# Patient Record
Sex: Female | Born: 1990
Health system: Southern US, Community
[De-identification: ages and names within clinical notes are randomized; demographics above are authoritative.]

## PROBLEM LIST (undated history)

## (undated) DIAGNOSIS — M549 Dorsalgia, unspecified: Secondary | ICD-10-CM

## (undated) DIAGNOSIS — J45909 Unspecified asthma, uncomplicated: Secondary | ICD-10-CM

## (undated) DIAGNOSIS — L732 Hidradenitis suppurativa: Secondary | ICD-10-CM

## (undated) HISTORY — PX: NO PAST SURGERIES: SHX2092

## (undated) HISTORY — PX: TUBAL LIGATION: SHX77

---

## 2015-06-08 ENCOUNTER — Emergency Department (HOSPITAL_BASED_OUTPATIENT_CLINIC_OR_DEPARTMENT_OTHER)
Admission: EM | Admit: 2015-06-08 | Discharge: 2015-06-08 | Discharge status: Against Medical Advice | Payer: Medicaid Other | Attending: Emergency Medicine | Admitting: Emergency Medicine

## 2015-06-08 ENCOUNTER — Encounter (HOSPITAL_BASED_OUTPATIENT_CLINIC_OR_DEPARTMENT_OTHER): Payer: Self-pay | Admitting: *Deleted

## 2015-06-08 ENCOUNTER — Emergency Department (HOSPITAL_BASED_OUTPATIENT_CLINIC_OR_DEPARTMENT_OTHER): Payer: Medicaid Other

## 2015-06-08 DIAGNOSIS — F17210 Nicotine dependence, cigarettes, uncomplicated: Secondary | ICD-10-CM | POA: Diagnosis not present

## 2015-06-08 DIAGNOSIS — M25461 Effusion, right knee: Secondary | ICD-10-CM | POA: Insufficient documentation

## 2015-06-08 DIAGNOSIS — M25561 Pain in right knee: Secondary | ICD-10-CM | POA: Diagnosis not present

## 2015-06-08 NOTE — ED Notes (Signed)
Right knee swelling and pain x 2 days. No injury.

## 2016-07-04 ENCOUNTER — Emergency Department (HOSPITAL_BASED_OUTPATIENT_CLINIC_OR_DEPARTMENT_OTHER)
Admission: EM | Admit: 2016-07-04 | Discharge: 2016-07-04 | Disposition: A | Discharge status: Home / Self Care | Payer: Medicaid Other | Attending: Emergency Medicine | Admitting: Emergency Medicine

## 2016-07-04 ENCOUNTER — Encounter (HOSPITAL_BASED_OUTPATIENT_CLINIC_OR_DEPARTMENT_OTHER): Payer: Self-pay | Admitting: *Deleted

## 2016-07-04 DIAGNOSIS — F17210 Nicotine dependence, cigarettes, uncomplicated: Secondary | ICD-10-CM | POA: Insufficient documentation

## 2016-07-04 DIAGNOSIS — J45909 Unspecified asthma, uncomplicated: Secondary | ICD-10-CM | POA: Insufficient documentation

## 2016-07-04 DIAGNOSIS — K029 Dental caries, unspecified: Secondary | ICD-10-CM | POA: Insufficient documentation

## 2016-07-04 HISTORY — DX: Unspecified asthma, uncomplicated: J45.909

## 2016-07-04 MED ORDER — BUPIVACAINE-EPINEPHRINE (PF) 0.5% -1:200000 IJ SOLN
1.8000 mL | Freq: Once | INTRAMUSCULAR | Status: AC
  Administered 2016-07-04: 1.8 mL
  Filled 2016-07-04: qty 1.8

## 2016-07-04 MED ORDER — ACETAMINOPHEN 500 MG PO TABS
1000.0000 mg | ORAL_TABLET | Freq: Once | ORAL | Status: AC
  Administered 2016-07-04: 1000 mg via ORAL
  Filled 2016-07-04: qty 2

## 2016-07-04 MED ORDER — OXYCODONE HCL 5 MG PO TABS
5.0000 mg | ORAL_TABLET | Freq: Once | ORAL | Status: AC
  Administered 2016-07-04: 5 mg via ORAL
  Filled 2016-07-04: qty 1

## 2016-07-04 MED ORDER — IBUPROFEN 800 MG PO TABS
800.0000 mg | ORAL_TABLET | Freq: Once | ORAL | Status: AC
  Administered 2016-07-04: 800 mg via ORAL
  Filled 2016-07-04: qty 1

## 2016-07-04 MED ORDER — PENICILLIN V POTASSIUM 500 MG PO TABS
1000.0000 mg | ORAL_TABLET | Freq: Two times a day (BID) | ORAL | 0 refills | Status: DC
Start: 2016-07-04 — End: 2019-01-28

## 2016-07-04 MED FILL — PENICILLIN VK 500 MG TABLET: 500 | 7 days supply | Qty: 28 | Fill #0

## 2016-07-04 NOTE — ED Provider Notes (Signed)
[facility] Provider Note   CSN: [id number] Arrival date & time: 07/04/16  1001     History   Chief Complaint Chief Complaint  Patient presents with  . Dental Pain    HPI Dawn Gould is a 26 y.o. female.  26 yo F with a chief complaint of dental pain. Going on for the past couple days. History of similar problems in the past. Called her dentist since it was a 3 month weight. Denies fevers denies nausea vomiting. Denies difficulty with swallowing. No swelling.   The history is provided by the patient.  Dental Pain   This is a new problem. The current episode started more than 2 days ago. The problem occurs constantly. The problem has not changed since onset.The pain is at a severity of 9/10. The treatment provided no relief.    Past Medical History:  Diagnosis Date  . Asthma     There are no active problems to display for this patient.   History reviewed. No pertinent surgical history.  OB History    No data available       Home Medications    Prior to Admission medications   Medication Sig Start Date End Date Taking? Authorizing Provider  penicillin v potassium (VEETID) 500 MG tablet Take 2 tablets (1,000 mg total) by mouth 2 (two) times daily. X 7 days 07/04/16   [name], DO    Family History History reviewed. No pertinent family history.  Social History Social History  Substance Use Topics  . Smoking status: Current Every Day Smoker    Types: Cigarettes  . Smokeless tobacco: Never Used  . Alcohol use No     Allergies   Patient has no known allergies.   Review of Systems Review of Systems  Constitutional: Negative for chills and fever.  HENT: Positive for dental problem. Negative for congestion and rhinorrhea.   Eyes: Negative for redness and visual disturbance.  Respiratory: Negative for shortness of breath and wheezing.   Cardiovascular: Negative for chest pain and palpitations.  Gastrointestinal: Negative for nausea and  vomiting.  Genitourinary: Negative for dysuria and urgency.  Musculoskeletal: Negative for arthralgias and myalgias.  Skin: Negative for pallor and wound.  Neurological: Positive for headaches. Negative for dizziness.     Physical Exam Updated Vital Signs BP 114/83 (BP Location: Left Arm)   Pulse 66   Temp 99.1 F (37.3 C) (Oral)   Resp 18   LMP 07/04/2016   SpO2 100%   Physical Exam  Constitutional: She is oriented to person, place, and time. She appears well-developed and well-nourished. No distress.  HENT:  Head: Normocephalic and atraumatic.  Mouth/Throat:    Tolerating secretions without difficulty. No sublingual swelling. No areas of erythema or edema.  Eyes: EOM are normal. Pupils are equal, round, and reactive to light.  Neck: Normal range of motion. Neck supple.  Cardiovascular: Normal rate and regular rhythm.  Exam reveals no gallop and no friction rub.   No murmur heard. Pulmonary/Chest: Effort normal. She has no wheezes. She has no rales.  Abdominal: Soft. She exhibits no distension. There is no tenderness.  Musculoskeletal: She exhibits no edema or tenderness.  Neurological: She is alert and oriented to person, place, and time.  Skin: Skin is warm and dry. She is not diaphoretic.  Psychiatric: She has a normal mood and affect. Her behavior is normal.  Nursing note and vitals reviewed.    ED Treatments / Results  Labs (all labs ordered are listed,  but only abnormal results are displayed) Labs Reviewed - No data to display  EKG  EKG Interpretation None       Radiology No results found.  Procedures .Nerve Block Date/Time: 07/04/2016 10:55 AM Performed by: Adela LankFLOYD, Wynnie Pacetti Authorized by: Melene PlanFLOYD, Gearold Wainer   Consent:    Consent obtained:  Verbal   Consent given by:  Patient   Risks discussed:  Allergic reaction, infection, nerve damage, pain and intravenous injection   Alternatives discussed:  No treatment Indications:    Indications:  Pain  relief Location:    Body area:  Head   Head nerve blocked: inferior alveolar.   Laterality:  Right Skin anesthesia (see MAR for exact dosages):    Skin anesthesia method:  None Procedure details (see MAR for exact dosages):    Block needle gauge:  27 G   Anesthetic injected:  Bupivacaine 0.25% WITH epi   Steroid injected:  None   Additive injected:  None   Injection procedure:  Anatomic landmarks identified and anatomic landmarks palpated   Paresthesia:  None Post-procedure details:    Dressing:  None   Outcome:  Pain relieved   Patient tolerance of procedure:  Tolerated well, no immediate complications   (including critical care time)  Medications Ordered in ED Medications  bupivacaine-epinephrine (MARCAINE W/ EPI) 0.5% -1:200000 injection 1.8 mL (1.8 mLs Infiltration Given by Other 07/04/16 1034)  acetaminophen (TYLENOL) tablet 1,000 mg (1,000 mg Oral Given 07/04/16 1033)  ibuprofen (ADVIL,MOTRIN) tablet 800 mg (800 mg Oral Given 07/04/16 1033)  oxyCODONE (Oxy IR/ROXICODONE) immediate release tablet 5 mg (5 mg Oral Given 07/04/16 1033)     Initial Impression / Assessment and Plan / ED Course  I have reviewed the triage vital signs and the nursing notes.  Pertinent labs & imaging results that were available during my care of the patient were reviewed by me and considered in my medical decision making (see chart for details).     26 yo F With dental pain. Dental block performed. Dental follow-up.  10:57 AM:  I have discussed the diagnosis/risks/treatment options with the patient and believe the pt to be eligible for discharge home to follow-up with Dentist. We also discussed returning to the ED immediately if new or worsening sx occur. We discussed the sx which are most concerning (e.g., sudden worsening pain, fever, inability to tolerate by mouth) that necessitate immediate return. Medications administered to the patient during their visit and any new prescriptions provided to the  patient are listed below.  Medications given during this visit Medications  bupivacaine-epinephrine (MARCAINE W/ EPI) 0.5% -1:200000 injection 1.8 mL (1.8 mLs Infiltration Given by Other 07/04/16 1034)  acetaminophen (TYLENOL) tablet 1,000 mg (1,000 mg Oral Given 07/04/16 1033)  ibuprofen (ADVIL,MOTRIN) tablet 800 mg (800 mg Oral Given 07/04/16 1033)  oxyCODONE (Oxy IR/ROXICODONE) immediate release tablet 5 mg (5 mg Oral Given 07/04/16 1033)     The patient appears reasonably screen and/or stabilized for discharge and I doubt any other medical condition or other [facility] requiring further screening, evaluation, or treatment in the ED at this time prior to discharge.    Final Clinical Impressions(s) / ED Diagnoses   Final diagnoses:  Pain due to dental caries    New Prescriptions New Prescriptions   PENICILLIN V POTASSIUM (VEETID) 500 MG TABLET    Take 2 tablets (1,000 mg total) by mouth 2 (two) times daily. X 7 days     Melene Planan Dhalia Zingaro, DO 07/04/16 1057

## 2016-07-04 NOTE — ED Notes (Signed)
ED Provider at bedside. 

## 2016-07-04 NOTE — Discharge Instructions (Signed)
Take 4 over the counter ibuprofen tablets 3 times a day or 2 over-the-counter naproxen tablets twice a day for pain. Also take tylenol 1000mg(2 extra strength) four times a day.    

## 2016-07-04 NOTE — ED Triage Notes (Signed)
Pt reports 3 days of right sided tooth pain upper and lower, also right ear pain causing her to have a right sided headache.

## 2016-08-15 ENCOUNTER — Emergency Department (HOSPITAL_BASED_OUTPATIENT_CLINIC_OR_DEPARTMENT_OTHER)
Admission: EM | Admit: 2016-08-15 | Discharge: 2016-08-15 | Disposition: A | Discharge status: Home / Self Care | Payer: Medicaid Other | Attending: Emergency Medicine | Admitting: Emergency Medicine

## 2016-08-15 ENCOUNTER — Encounter (HOSPITAL_BASED_OUTPATIENT_CLINIC_OR_DEPARTMENT_OTHER): Payer: Self-pay | Admitting: *Deleted

## 2016-08-15 DIAGNOSIS — J069 Acute upper respiratory infection, unspecified: Secondary | ICD-10-CM | POA: Insufficient documentation

## 2016-08-15 DIAGNOSIS — F17210 Nicotine dependence, cigarettes, uncomplicated: Secondary | ICD-10-CM | POA: Insufficient documentation

## 2016-08-15 DIAGNOSIS — R112 Nausea with vomiting, unspecified: Secondary | ICD-10-CM | POA: Insufficient documentation

## 2016-08-15 DIAGNOSIS — R109 Unspecified abdominal pain: Secondary | ICD-10-CM | POA: Insufficient documentation

## 2016-08-15 DIAGNOSIS — J45909 Unspecified asthma, uncomplicated: Secondary | ICD-10-CM | POA: Insufficient documentation

## 2016-08-15 MED ORDER — BENZONATATE 100 MG PO CAPS: 100.0000 mg | ORAL_CAPSULE | Freq: Three times a day (TID) | ORAL | 0 refills | Status: DC

## 2016-08-15 MED ORDER — DM-GUAIFENESIN ER 30-600 MG PO TB12
1.0000 | ORAL_TABLET | Freq: Two times a day (BID) | ORAL | 0 refills | Status: DC | PRN
Start: 2016-08-15 — End: 2019-01-28

## 2016-08-15 MED ORDER — FLUTICASONE PROPIONATE 50 MCG/ACT NA SUSP: 2.0000 | Freq: Every day | NASAL | 0 refills | Status: DC

## 2016-08-15 MED ORDER — ONDANSETRON 4 MG PO TBDP: 4.0000 mg | ORAL_TABLET | Freq: Three times a day (TID) | ORAL | 0 refills | Status: DC | PRN

## 2016-08-15 MED ORDER — ONDANSETRON 4 MG PO TBDP
4.0000 mg | ORAL_TABLET | Freq: Once | ORAL | Status: AC
  Administered 2016-08-15: 4 mg via ORAL
  Filled 2016-08-15: qty 1

## 2016-08-15 MED ORDER — OSELTAMIVIR PHOSPHATE 75 MG PO CAPS: 75.0000 mg | ORAL_CAPSULE | Freq: Two times a day (BID) | ORAL | 0 refills | Status: DC

## 2016-08-15 MED ORDER — ACETAMINOPHEN 500 MG PO TABS
1000.0000 mg | ORAL_TABLET | Freq: Once | ORAL | Status: AC
  Administered 2016-08-15: 1000 mg via ORAL
  Filled 2016-08-15: qty 2

## 2016-08-15 MED FILL — ONDANSETRON ODT 4 MG TABLET: 4 | 3 days supply | Qty: 10 | Fill #0

## 2016-08-15 MED FILL — MUCINEX DM ER 600-30 MG TAB: 30-600 | 10 days supply | Qty: 20 | Fill #0

## 2016-08-15 MED FILL — BENZONATATE 100 MG CAP: 100 | 7 days supply | Qty: 21 | Fill #0

## 2016-08-15 MED FILL — FLUTICASONE PROP 50 MCG SPR: 50 | 30 days supply | Qty: 16 | Fill #0

## 2016-08-15 NOTE — ED Triage Notes (Signed)
Cough, chills, body aches and vomiting since yesterday.

## 2016-08-15 NOTE — ED Provider Notes (Signed)
[facility]   CSN: [id number] Arrival date & time: 08/15/16  1359   By signing my name below, I, Dawn Gould, attest that this documentation has been prepared under the direction and in the presence of Dawn Gould, [place]. Electronically Signed: Clarisse Gould, Scribe. 08/15/16. 5:03 PM.   History   Chief Complaint Chief Complaint  Patient presents with  . Cough   The history is provided by the patient and medical records. No language interpreter was used.    HPI Comments: Dawn Gould is a 26 y.o. female who presents to the Emergency Department complaining of worsening productive cough x 2 days. She notes yellow sputum in cough without blood. Pt notes associated fever, chills, post-tussive vomit, body aches, chest tightness and abdominal pain only when coughing, congestion and rhinorrhea. Pt states she is unable to keep solids or fluids down. She reports she has not attempted any treatment at home. She states Zofran and tylenol administered in the ED have brought relief to cough and nausea. Hx of asthma noted. She adds she ran out of albuterol at home. Baseline constipation and Hx of hemorrhoids reported. Pt denies sore throat, dysuria, hematuria, bowel changes and Hx of COPD.  Past Medical History:  Diagnosis Date  . Asthma     There are no active problems to display for this patient.   History reviewed. No pertinent surgical history.  OB History    No data available       Home Medications    Prior to Admission medications   Medication Sig Start Date End Date Taking? Authorizing Provider  benzonatate (TESSALON) 100 MG capsule Take 1 capsule (100 mg total) by mouth every 8 (eight) hours. 08/15/16   Dawn Gould [place], [place]  dextromethorphan-guaiFENesin (MUCINEX DM) 30-600 MG 12hr tablet Take 1 tablet by mouth 2 (two) times daily as needed for cough. 08/15/16   Dawn Gould [place], [place]  fluticasone (FLONASE) 50 MCG/ACT nasal spray Place 2  sprays into both nostrils daily. 08/15/16   [name] Gould Dawn Gould, [place]  ondansetron (ZOFRAN ODT) 4 MG disintegrating tablet Take 1 tablet (4 mg total) by mouth every 8 (eight) hours as needed for nausea or vomiting. 08/15/16   Dawn Gould [place], [place]  oseltamivir (TAMIFLU) 75 MG capsule Take 1 capsule (75 mg total) by mouth every 12 (twelve) hours. 08/15/16   Dawn Gould Gould [place], [place]  penicillin v potassium (VEETID) 500 MG tablet Take 2 tablets (1,000 mg total) by mouth 2 (two) times daily. X 7 days 07/04/16   Dawn Plan, DO    Family History No family history on file.  Social History Social History  Substance Use Topics  . Smoking status: Current Every Day Smoker    Types: Cigarettes  . Smokeless tobacco: Never Used  . Alcohol use No     Allergies   Tramadol   Review of Systems Review of Systems  Constitutional: Positive for chills. Negative for diaphoresis and fever.  HENT: Positive for congestion and rhinorrhea. Negative for sore throat.   Respiratory: Positive for cough and chest tightness.   Gastrointestinal: Positive for abdominal pain, nausea and vomiting. Negative for constipation and diarrhea.  Genitourinary: Negative for dysuria and hematuria.  Musculoskeletal: Positive for arthralgias and myalgias.  All other systems reviewed and are negative.    Physical Exam Updated Vital Signs BP 113/67 (BP Location: Left Arm)   Pulse 105   Temp 98.8 F (37.1 C) (Oral)   Resp 16   Ht 5\' 3"  (1.6 m)  Wt 137 lb (62.1 kg)   LMP 08/02/2016   SpO2 100%   BMI 24.27 kg/m   Physical Exam  Constitutional: She is oriented to person, place, and time. She appears well-developed and well-nourished. No distress.  Well appearing  HENT:  Head: Normocephalic and atraumatic.  Right Ear: External ear normal.  Left Ear: External ear normal.  Nose: Nose normal.  Mouth/Throat: Oropharynx is clear and moist. No oropharyngeal exudate.  Oropharynx without evidence of redness or  exudates. Tonsils without evidence of redness, swelling, or exudates. TM's appear normal with no evidence of bulging. EAC appear non erythematous and not swollen  Eyes: EOM are normal. Pupils are equal, round, and reactive to light.  Neck: Normal range of motion. Neck supple.  Normal ROM. No nuchal rigidity.   Cardiovascular: Normal rate, regular rhythm and normal heart sounds.  Exam reveals no gallop and no friction rub.   No murmur heard. Pulmonary/Chest: Effort normal and breath sounds normal. No respiratory distress. She has no wheezes. She has no rales.  Lungs CTA. No wheezing. No rales. No stridor. Normal work of breathing  Abdominal: Soft. She exhibits no distension. There is no tenderness. There is no rebound and no guarding.  Soft and nontender. No rebound. No guarding. Negative murphy's sign. No focal tenderness at McBurney's point. No CVA tenderness. No evidence of hernia  Musculoskeletal: She exhibits no edema or tenderness.  Neurological: She is alert and oriented to person, place, and time.  Skin: Skin is warm and dry. She is not diaphoretic.  Psychiatric: She has a normal mood and affect. Her behavior is normal.  Nursing note and vitals reviewed.    ED Treatments / Results  DIAGNOSTIC STUDIES: Oxygen Saturation is 100% on RA, normal by my interpretation.    COORDINATION OF CARE: 5:01 PM Discussed treatment Gould with pt at bedside and pt agreed to Gould. Will order medications and prepare pt for discharge.  Labs (all labs ordered are listed, but only abnormal results are displayed) Labs Reviewed - No data to display  EKG  EKG Interpretation None       Radiology No results found.  Procedures Procedures (including critical care time)  Medications Ordered in ED Medications  acetaminophen (TYLENOL) tablet 1,000 mg (1,000 mg Oral Given 08/15/16 1412)  ondansetron (ZOFRAN-ODT) disintegrating tablet 4 mg (4 mg Oral Given 08/15/16 1412)     Initial Impression /  Assessment and Gould / ED Course  I have reviewed the triage vital signs and the nursing notes.  Pertinent labs & imaging results that were available during my care of the patient were reviewed by me and considered in my medical decision making (see chart for details).      Patient with symptoms consistent with upper respiratory infection, possible influenza.  Vitals are stable, no fever.  No signs of dehydration, tolerating PO's.  Lungs are clear. Due to patient's presentation and physical exam a chest x-ray was not ordered.  Discussed the cost versus benefit of Tamiflu treatment with the patient.  The patient understands that symptoms are within the recommended 24-48 hour window of treatment.  Patient will be discharged with instructions to orally hydrate, rest, and use over-the-counter medications such as anti-inflammatories ibuprofen and Aleve for muscle aches and Tylenol for fever.  Patient will also be given a cough suppressant, flonase, mucinex. Patient verbally understands instructions. Reasons to immediately return to the emergency department discussed.  I personally performed the services described in this documentation, which was scribed in my presence.  The recorded information has been reviewed and is accurate.  Final Clinical Impressions(s) / ED Diagnoses   Final diagnoses:  Upper respiratory tract infection, unspecified type    New Prescriptions Discharge Medication List as of 08/15/2016  5:24 PM    START taking these medications   Details  benzonatate (TESSALON) 100 MG capsule Take 1 capsule (100 mg total) by mouth every 8 (eight) hours., Starting Tue 08/15/2016, Print    dextromethorphan-guaiFENesin (MUCINEX DM) 30-600 MG 12hr tablet Take 1 tablet by mouth 2 (two) times daily as needed for cough., Starting Tue 08/15/2016, Print    fluticasone (FLONASE) 50 MCG/ACT nasal spray Place 2 sprays into both nostrils daily., Starting Tue 08/15/2016, Print    ondansetron (ZOFRAN ODT) 4  MG disintegrating tablet Take 1 tablet (4 mg total) by mouth every 8 (eight) hours as needed for nausea or vomiting., Starting Tue 08/15/2016, Print    oseltamivir (TAMIFLU) 75 MG capsule Take 1 capsule (75 mg total) by mouth every 12 (twelve) hours., Starting Tue 08/15/2016, Print         [address] [place], [place] 08/16/16 1610    Loren Racer, MD 08/18/16 [phone]

## 2016-08-15 NOTE — Discharge Instructions (Signed)
1. Medications: flonase, mucinex, tessalon, tamiflu 2. Treatment: rest, drink plenty of fluids, take tylenol or ibuprofen for fever control 3. Follow Up: Please followup with your primary doctor in 3 days for discussion of your diagnoses and further evaluation after today's visit; if you do not have a primary care doctor use the resource guide provided to find one; Return to the ER for high fevers, difficulty breathing or other concerning symptoms

## 2017-01-18 ENCOUNTER — Encounter (HOSPITAL_BASED_OUTPATIENT_CLINIC_OR_DEPARTMENT_OTHER): Payer: Self-pay | Admitting: *Deleted

## 2017-01-18 ENCOUNTER — Emergency Department (HOSPITAL_BASED_OUTPATIENT_CLINIC_OR_DEPARTMENT_OTHER)
Admission: EM | Admit: 2017-01-18 | Discharge: 2017-01-18 | Disposition: A | Discharge status: Home / Self Care | Payer: No Typology Code available for payment source | Attending: Emergency Medicine | Admitting: Emergency Medicine

## 2017-01-18 DIAGNOSIS — J45909 Unspecified asthma, uncomplicated: Secondary | ICD-10-CM | POA: Diagnosis not present

## 2017-01-18 DIAGNOSIS — M62830 Muscle spasm of back: Secondary | ICD-10-CM | POA: Insufficient documentation

## 2017-01-18 DIAGNOSIS — S3992XA Unspecified injury of lower back, initial encounter: Secondary | ICD-10-CM | POA: Diagnosis present

## 2017-01-18 DIAGNOSIS — F17210 Nicotine dependence, cigarettes, uncomplicated: Secondary | ICD-10-CM | POA: Diagnosis not present

## 2017-01-18 DIAGNOSIS — Y9301 Activity, walking, marching and hiking: Secondary | ICD-10-CM | POA: Diagnosis not present

## 2017-01-18 DIAGNOSIS — Y929 Unspecified place or not applicable: Secondary | ICD-10-CM | POA: Insufficient documentation

## 2017-01-18 DIAGNOSIS — W109XXA Fall (on) (from) unspecified stairs and steps, initial encounter: Secondary | ICD-10-CM | POA: Insufficient documentation

## 2017-01-18 DIAGNOSIS — Y999 Unspecified external cause status: Secondary | ICD-10-CM | POA: Insufficient documentation

## 2017-01-18 DIAGNOSIS — S39012A Strain of muscle, fascia and tendon of lower back, initial encounter: Secondary | ICD-10-CM | POA: Insufficient documentation

## 2017-01-18 HISTORY — DX: Dorsalgia, unspecified: M54.9

## 2017-01-18 LAB — PREGNANCY, URINE: PREG TEST UR: NEGATIVE

## 2017-01-18 MED ORDER — CYCLOBENZAPRINE HCL 10 MG PO TABS: 10.0000 mg | ORAL_TABLET | Freq: Every day | ORAL | 0 refills | Status: AC
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
## 2017-01-18 NOTE — ED Notes (Signed)
Pt given note for work and directed to pharmacy to pick up Rx

## 2017-01-18 NOTE — Discharge Instructions (Signed)
Use heat pads intermittently throughout the day. Massage therapy also may be beneficial. Slow lower back stretches will also be beneficial. He may use topical muscle creams, over-the-counter medicines such as ibuprofen, Aleve, or Tylenol.
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
## 2017-05-08 ENCOUNTER — Other Ambulatory Visit: Payer: Self-pay

## 2017-05-08 ENCOUNTER — Emergency Department (HOSPITAL_BASED_OUTPATIENT_CLINIC_OR_DEPARTMENT_OTHER)
Admission: EM | Admit: 2017-05-08 | Discharge: 2017-05-08 | Disposition: A | Discharge status: Home / Self Care | Payer: Self-pay | Attending: Emergency Medicine | Admitting: Emergency Medicine

## 2017-05-08 ENCOUNTER — Emergency Department (HOSPITAL_BASED_OUTPATIENT_CLINIC_OR_DEPARTMENT_OTHER): Payer: Self-pay

## 2017-05-08 ENCOUNTER — Encounter (HOSPITAL_BASED_OUTPATIENT_CLINIC_OR_DEPARTMENT_OTHER): Payer: Self-pay | Admitting: Emergency Medicine

## 2017-05-08 DIAGNOSIS — F17210 Nicotine dependence, cigarettes, uncomplicated: Secondary | ICD-10-CM | POA: Insufficient documentation

## 2017-05-08 DIAGNOSIS — Y999 Unspecified external cause status: Secondary | ICD-10-CM | POA: Insufficient documentation

## 2017-05-08 DIAGNOSIS — X503XXA Overexertion from repetitive movements, initial encounter: Secondary | ICD-10-CM | POA: Insufficient documentation

## 2017-05-08 DIAGNOSIS — Z79899 Other long term (current) drug therapy: Secondary | ICD-10-CM | POA: Insufficient documentation

## 2017-05-08 DIAGNOSIS — M25461 Effusion, right knee: Secondary | ICD-10-CM | POA: Insufficient documentation

## 2017-05-08 DIAGNOSIS — S86911A Strain of unspecified muscle(s) and tendon(s) at lower leg level, right leg, initial encounter: Secondary | ICD-10-CM | POA: Insufficient documentation

## 2017-05-08 DIAGNOSIS — J45909 Unspecified asthma, uncomplicated: Secondary | ICD-10-CM | POA: Insufficient documentation

## 2017-05-08 DIAGNOSIS — Y929 Unspecified place or not applicable: Secondary | ICD-10-CM | POA: Insufficient documentation

## 2017-05-08 DIAGNOSIS — Y9389 Activity, other specified: Secondary | ICD-10-CM | POA: Insufficient documentation

## 2017-05-08 MED ORDER — HYDROCODONE-ACETAMINOPHEN 5-325 MG PO TABS: 1.0000 | ORAL_TABLET | ORAL | 0 refills | Status: DC | PRN

## 2017-05-08 MED ORDER — IBUPROFEN 800 MG PO TABS
800.0000 mg | ORAL_TABLET | Freq: Once | ORAL | Status: AC
  Administered 2017-05-08: 800 mg via ORAL
  Filled 2017-05-08: qty 1

## 2017-05-08 MED ORDER — IBUPROFEN 600 MG PO TABS: 600.0000 mg | ORAL_TABLET | Freq: Four times a day (QID) | ORAL | 0 refills | Status: DC | PRN

## 2017-05-08 NOTE — Discharge Instructions (Signed)
1. Schedule recheck with orthopedics since you have had 2 knee effusions occur with only minor trauma. 2.  Follow instructions for resting elevating icing and compression. 3. Use crutches and no weightbearing until seen by orthopedics in follow-up.

## 2017-05-08 NOTE — ED Provider Notes (Signed)
[facility] EMERGENCY DEPARTMENT Provider Note   CSN: [id number] Arrival date & time: 05/08/17  1724     History   Chief Complaint Chief Complaint  Patient presents with  . Knee Pain    HPI Dawn Gould is a 26 y.o. female.  HPI Patient reports that she had been on the floor helping her nephew do homework.  She had gotten up and down a couple times without difficulty.  He then got up to answer the door and quite quickly started getting pain in her right knee and swelling and stiffness.  Reports this happened once before and she had "fluid on the knee".  She reports she had been jumping on a trampoline and then got out and was walking and noticed this same phenomenon develop.  She reports she went to the emergency department in the took "fluid" out.  She reports it looked like just blood, not clear fluid.  She reports after that is started to improve and she did not seek orthopedic follow-up.  She reports in high school she did do sports of basketball and ran track.  She did not have any serious knee injuries that she recalls.  She never had surgical interventions.  She reports she is otherwise healthy and not on any medications. Past Medical History:  Diagnosis Date  . Asthma   . Back pain     There are no active problems to display for this patient.   History reviewed. No pertinent surgical history.  OB History    No data available       Home Medications    Prior to Admission medications   Medication Sig Start Date End Date Taking? Authorizing Provider  benzonatate (TESSALON) 100 MG capsule Take 1 capsule (100 mg total) by mouth every 8 (eight) hours. 08/15/16   Alvina ChouEspina, Francisco Manuel, PA  dextromethorphan-guaiFENesin (MUCINEX DM) 30-600 MG 12hr tablet Take 1 tablet by mouth 2 (two) times daily as needed for cough. 08/15/16   Espina, Lucita LoraFrancisco Manuel, PA  fluticasone (FLONASE) 50 MCG/ACT nasal spray Place 2 sprays into both nostrils daily. 08/15/16   Alvina ChouEspina,  Francisco Manuel, PA  HYDROcodone-acetaminophen (NORCO/VICODIN) 5-325 MG tablet Take 1-2 tablets by mouth every 4 (four) hours as needed for moderate pain or severe pain. 05/08/17   Arby BarrettePfeiffer, Pollie Poma, MD  ibuprofen (ADVIL,MOTRIN) 600 MG tablet Take 1 tablet (600 mg total) by mouth every 6 (six) hours as needed. 05/08/17   Arby BarrettePfeiffer, Shakena Callari, MD  ondansetron (ZOFRAN ODT) 4 MG disintegrating tablet Take 1 tablet (4 mg total) by mouth every 8 (eight) hours as needed for nausea or vomiting. 08/15/16   Espina, Lucita LoraFrancisco Manuel, PA  oseltamivir (TAMIFLU) 75 MG capsule Take 1 capsule (75 mg total) by mouth every 12 (twelve) hours. 08/15/16   Espina, Lucita LoraFrancisco Manuel, PA  penicillin v potassium (VEETID) 500 MG tablet Take 2 tablets (1,000 mg total) by mouth 2 (two) times daily. X 7 days 07/04/16   Melene PlanFloyd, Dan, DO    Family History History reviewed. No pertinent family history.  Social History Social History   Tobacco Use  . Smoking status: Current Every Day Smoker    Types: Cigarettes  . Smokeless tobacco: Never Used  Substance Use Topics  . Alcohol use: No  . Drug use: No     Allergies   Tramadol   Review of Systems Review of Systems 10 Systems reviewed and are negative for acute change except as noted in the HPI.   Physical Exam Updated Vital Signs  BP 118/66 (BP Location: Left Arm)   Pulse 77   Temp 98.6 F (37 C) (Oral)   Resp 16   Ht 5\' 3"  (1.6 m)   Wt 68 kg (150 lb)   LMP 05/04/2017   SpO2 100%   BMI 26.57 kg/m   Physical Exam  Constitutional: She is oriented to person, place, and time. She appears well-developed and well-nourished. No distress.  HENT:  Head: Normocephalic and atraumatic.  Eyes: EOM are normal.  Pulmonary/Chest: Effort normal.  Musculoskeletal:  Patient has moderate to large effusion of the right knee.  No erythema.  Calf is soft nontender.  Foot without swelling.  Distal pulse 2+.  Pain exacerbated by full extension or flexion beyond about 140 degrees.  Left  knee normal.  Neurological: She is alert and oriented to person, place, and time. No cranial nerve deficit. She exhibits normal muscle tone. Coordination normal.  Skin: Skin is warm and dry.  Psychiatric: She has a normal mood and affect.     ED Treatments / Results  Labs (all labs ordered are listed, but only abnormal results are displayed) Labs Reviewed - No data to display  EKG  EKG Interpretation None       Radiology Dg Knee Complete 4 Views Right  Result Date: 05/08/2017 CLINICAL DATA:  26 year old female with right knee pain and swelling. No injury. Initial encounter. EXAM: RIGHT KNEE - COMPLETE 4+ VIEW COMPARISON:  06/08/2015. FINDINGS: No fracture or dislocation. No plain film evidence of significant degenerative changes. Small to moderate-size joint effusion. IMPRESSION: Joint effusion. No osseous abnormality. If further delineation for possible internal derangement or cartilaginous abnormality as as cause of recurrent joint effusion is clinically desired then MR imaging may be considered. Electronically Signed   By: Lacy DuverneySteven  Olson M.D.   On: 05/08/2017 18:01    Procedures Procedures (including critical care time)  Medications Ordered in ED Medications  ibuprofen (ADVIL,MOTRIN) tablet 800 mg (not administered)     Initial Impression / Assessment and Plan / ED Course  I have reviewed the triage vital signs and the nursing notes.  Pertinent labs & imaging results that were available during my care of the patient were reviewed by me and considered in my medical decision making (see chart for details).     Final Clinical Impressions(s) / ED Diagnoses   Final diagnoses:  Effusion of right knee  Strain of right knee, initial encounter  At this time, findings most consistent with a hemarthrosis.  Suspect, based on the patient's history of mechanism of injury and this being the second episode, the patient has a partial ligamentous tear that in certain positions is weak  and tears very easily resulting in abrupt onset of a hemarthrosis.  Plan will be to compress with Ace wrap and placed in knee immobilizer with crutches.  Patient is advised that after this episode, it is important she follows up with orthopedics for further evaluation of the knee for stability or underlying cause for abrupt onset of effusion that is most likely hemarthrosis.  She reports when she is still in nonweightbearing there is not significant pain.   ED Discharge Orders        Ordered    ibuprofen (ADVIL,MOTRIN) 600 MG tablet  Every 6 hours PRN     05/08/17 1821    HYDROcodone-acetaminophen (NORCO/VICODIN) 5-325 MG tablet  Every 4 hours PRN     05/08/17 1821       Arby BarrettePfeiffer, Cassanda Walmer, MD 05/08/17 [phone]

## 2017-05-08 NOTE — ED Triage Notes (Signed)
Patient states that she was assiting her nephew with homework, went to stand up from the floor and hurt her right knee. Has a history of "fluid on my knee" in the past

## 2017-05-08 NOTE — ED Notes (Signed)
Patient demonstrated using crutches in the room with no difficulty.

## 2017-05-11 ENCOUNTER — Ambulatory Visit (INDEPENDENT_AMBULATORY_CARE_PROVIDER_SITE_OTHER): Payer: Self-pay | Admitting: Orthopaedic Surgery

## 2017-05-11 ENCOUNTER — Encounter (INDEPENDENT_AMBULATORY_CARE_PROVIDER_SITE_OTHER): Payer: Self-pay | Admitting: Orthopaedic Surgery

## 2017-05-11 DIAGNOSIS — M25561 Pain in right knee: Secondary | ICD-10-CM

## 2017-05-11 MED ORDER — METHYLPREDNISOLONE ACETATE 40 MG/ML IJ SUSP
40.0000 mg | INTRAMUSCULAR | Status: AC | PRN
  Administered 2017-05-11: 40 mg via INTRA_ARTICULAR

## 2017-05-11 MED ORDER — BUPIVACAINE HCL 0.5 % IJ SOLN
2.0000 mL | INTRAMUSCULAR | Status: AC | PRN
  Administered 2017-05-11: 2 mL via INTRA_ARTICULAR

## 2017-05-11 MED ORDER — LIDOCAINE HCL 1 % IJ SOLN
2.0000 mL | INTRAMUSCULAR | Status: AC | PRN
  Administered 2017-05-11: 2 mL

## 2017-05-11 NOTE — Progress Notes (Signed)
Office Visit Note   Patient: Dawn Gould           Date of Birth: [birth year]           MRN: [id number] Visit Date: 05/11/2017              Requested by: No referring provider defined for this encounter. PCP: Patient, No Pcp Per   Assessment & Plan: Visit Diagnoses:  1. Acute pain of right knee     Plan: Impression is right knee bloody effusion.  15 cc of bloody effusion was aspirated and then the knee was injected with cortisone today.  MRI to rule out structural abnormalities.  Activity as tolerated.  Effusion was sent for lab analysis.  Follow-Up Instructions: Return in about 10 days (around 05/21/2017).   Orders:  Orders Placed This Encounter  Procedures  . MR Knee Right w/o contrast  . Cell count + diff,  w/ cryst-synvl fld   No orders of the defined types were placed in this encounter.     Procedures: Large Joint Inj: R knee on 05/11/2017 9:05 PM Indications: pain Details: 22 G needle  Arthrogram: No  Medications: 40 mg methylPREDNISolone acetate 40 MG/ML; 2 mL lidocaine 1 %; 2 mL bupivacaine 0.5 % Aspirate: 15 mL bloody; sent for lab analysis Outcome: tolerated well, no immediate complications Consent was given by the patient. Patient was prepped and draped in the usual sterile fashion.       Clinical Data: No additional findings.   Subjective: Chief Complaint  Patient presents with  . Right Knee - Pain    Patient is a 26 year old female who comes in with recurrent right knee effusion.  She had a previous episode in which she developed a bloody effusion which was aspirated but the effusion reaccumulated.  She has never had a cortisone injection.  She denies any injuries.  She denies any mechanical symptoms.  Denies any radiation of pain or numbness and tingling.  She is ambulating with crutches today.  She is endorsing swelling and pain.    Review of Systems  Constitutional: Negative.   HENT: Negative.   Eyes: Negative.   Respiratory: Negative.    Cardiovascular: Negative.   Endocrine: Negative.   Musculoskeletal: Negative.   Neurological: Negative.   Hematological: Negative.   Psychiatric/Behavioral: Negative.   All other systems reviewed and are negative.    Objective: Vital Signs: LMP 05/04/2017   Physical Exam  Constitutional: She is oriented to person, place, and time. She appears well-developed and well-nourished.  HENT:  Head: Normocephalic and atraumatic.  Eyes: EOM are normal.  Neck: Neck supple.  Pulmonary/Chest: Effort normal.  Abdominal: Soft.  Neurological: She is alert and oriented to person, place, and time.  Skin: Skin is warm. Capillary refill takes less than 2 seconds.  Psychiatric: She has a normal mood and affect. Her behavior is normal. Judgment and thought content normal.  Nursing note and vitals reviewed.   Ortho Exam Right knee exam shows a moderate joint effusion.  There is no evidence of infection.  Collaterals and cruciates are stable. Specialty Comments:  No specialty comments available.  Imaging: No results found.   PMFS History: There are no active problems to display for this patient.  Past Medical History:  Diagnosis Date  . Asthma   . Back pain     History reviewed. No pertinent family history.  History reviewed. No pertinent surgical history. Social History   Occupational History  . Not on file  Tobacco  Use  . Smoking status: Current Every Day Smoker    Types: Cigarettes  . Smokeless tobacco: Never Used  Substance and Sexual Activity  . Alcohol use: No  . Drug use: No  . Sexual activity: Not on file

## 2017-05-12 LAB — TIQ-NTM

## 2017-05-12 LAB — SYNOVIAL CELL COUNT + DIFF, W/ CRYSTALS
Basophils, %: 0 %
Eosinophils-Synovial: 0 % (ref 0–2)
Lymphocytes-Synovial Fld: 13 % (ref 0–74)
Monocyte/Macrophage: 80 % — ABNORMAL HIGH (ref 0–69)
Neutrophil, Synovial: 7 % (ref 0–24)
Synoviocytes, %: 0 % (ref 0–15)
WBC, Synovial: 3515 cells/uL — ABNORMAL HIGH (ref <150)

## 2019-01-25 ENCOUNTER — Inpatient Hospital Stay (HOSPITAL_COMMUNITY): Payer: Medicaid Other | Admitting: Anesthesiology

## 2019-01-25 ENCOUNTER — Encounter (HOSPITAL_COMMUNITY): Payer: Self-pay

## 2019-01-25 ENCOUNTER — Inpatient Hospital Stay (HOSPITAL_COMMUNITY): Payer: Medicaid Other

## 2019-01-25 ENCOUNTER — Other Ambulatory Visit: Payer: Self-pay

## 2019-01-25 ENCOUNTER — Inpatient Hospital Stay (HOSPITAL_COMMUNITY)
Admission: AD | Admit: 2019-01-25 | Discharge: 2019-01-28 | DRG: 783 | Disposition: A | Discharge status: Home / Self Care | Payer: Medicaid Other | Attending: Obstetrics and Gynecology | Admitting: Obstetrics and Gynecology

## 2019-01-25 ENCOUNTER — Encounter (HOSPITAL_COMMUNITY)
Admission: AD | Disposition: A | Discharge status: Home / Self Care | Payer: Self-pay | Source: Home / Self Care | Attending: Obstetrics and Gynecology

## 2019-01-25 DIAGNOSIS — O3483 Maternal care for other abnormalities of pelvic organs, third trimester: Secondary | ICD-10-CM | POA: Diagnosis present

## 2019-01-25 DIAGNOSIS — O30043 Twin pregnancy, dichorionic/diamniotic, third trimester: Secondary | ICD-10-CM | POA: Diagnosis present

## 2019-01-25 DIAGNOSIS — O4703 False labor before 37 completed weeks of gestation, third trimester: Secondary | ICD-10-CM

## 2019-01-25 DIAGNOSIS — O42913 Preterm premature rupture of membranes, unspecified as to length of time between rupture and onset of labor, third trimester: Secondary | ICD-10-CM | POA: Diagnosis not present

## 2019-01-25 DIAGNOSIS — Z362 Encounter for other antenatal screening follow-up: Secondary | ICD-10-CM | POA: Diagnosis not present

## 2019-01-25 DIAGNOSIS — Z302 Encounter for sterilization: Secondary | ICD-10-CM

## 2019-01-25 DIAGNOSIS — F17210 Nicotine dependence, cigarettes, uncomplicated: Secondary | ICD-10-CM | POA: Diagnosis present

## 2019-01-25 DIAGNOSIS — O321XX1 Maternal care for breech presentation, fetus 1: Secondary | ICD-10-CM | POA: Diagnosis present

## 2019-01-25 DIAGNOSIS — O9081 Anemia of the puerperium: Secondary | ICD-10-CM | POA: Diagnosis not present

## 2019-01-25 DIAGNOSIS — O322XX2 Maternal care for transverse and oblique lie, fetus 2: Secondary | ICD-10-CM | POA: Diagnosis present

## 2019-01-25 DIAGNOSIS — Z3A34 34 weeks gestation of pregnancy: Secondary | ICD-10-CM

## 2019-01-25 DIAGNOSIS — Z20828 Contact with and (suspected) exposure to other viral communicable diseases: Secondary | ICD-10-CM | POA: Diagnosis present

## 2019-01-25 DIAGNOSIS — D62 Acute posthemorrhagic anemia: Secondary | ICD-10-CM | POA: Diagnosis not present

## 2019-01-25 DIAGNOSIS — N838 Other noninflammatory disorders of ovary, fallopian tube and broad ligament: Secondary | ICD-10-CM | POA: Diagnosis present

## 2019-01-25 DIAGNOSIS — O30049 Twin pregnancy, dichorionic/diamniotic, unspecified trimester: Secondary | ICD-10-CM

## 2019-01-25 DIAGNOSIS — O30009 Twin pregnancy, unspecified number of placenta and unspecified number of amniotic sacs, unspecified trimester: Secondary | ICD-10-CM

## 2019-01-25 DIAGNOSIS — O4700 False labor before 37 completed weeks of gestation, unspecified trimester: Secondary | ICD-10-CM

## 2019-01-25 DIAGNOSIS — O9952 Diseases of the respiratory system complicating childbirth: Secondary | ICD-10-CM | POA: Diagnosis present

## 2019-01-25 DIAGNOSIS — O99334 Smoking (tobacco) complicating childbirth: Secondary | ICD-10-CM | POA: Diagnosis present

## 2019-01-25 DIAGNOSIS — O329XX0 Maternal care for malpresentation of fetus, unspecified, not applicable or unspecified: Secondary | ICD-10-CM | POA: Diagnosis present

## 2019-01-25 DIAGNOSIS — O479 False labor, unspecified: Secondary | ICD-10-CM

## 2019-01-25 LAB — URINALYSIS, ROUTINE W REFLEX MICROSCOPIC
Bilirubin Urine: NEGATIVE
Glucose, UA: NEGATIVE mg/dL
Hgb urine dipstick: NEGATIVE
Ketones, ur: 5 mg/dL — AB
Nitrite: NEGATIVE
Protein, ur: NEGATIVE mg/dL
Specific Gravity, Urine: 1.01 (ref 1.005–1.030)
pH: 7 (ref 5.0–8.0)

## 2019-01-25 LAB — RAPID HIV SCREEN (HIV 1/2 AB+AG)
HIV 1/2 Antibodies: NONREACTIVE
HIV-1 P24 Antigen - HIV24: NONREACTIVE

## 2019-01-25 LAB — CBC
HCT: 29.7 % — ABNORMAL LOW (ref 36.0–46.0)
Hemoglobin: 9 g/dL — ABNORMAL LOW (ref 12.0–15.0)
MCH: 24.3 pg — ABNORMAL LOW (ref 26.0–34.0)
MCHC: 30.3 g/dL (ref 30.0–36.0)
MCV: 80.1 fL (ref 80.0–100.0)
Platelets: 215 10*3/uL (ref 150–400)
RBC: 3.71 MIL/uL — ABNORMAL LOW (ref 3.87–5.11)
RDW: 15.9 % — ABNORMAL HIGH (ref 11.5–15.5)
WBC: 10.5 10*3/uL (ref 4.0–10.5)
nRBC: 0 % (ref 0.0–0.2)

## 2019-01-25 LAB — WET PREP, GENITAL
Clue Cells Wet Prep HPF POC: NONE SEEN
Sperm: NONE SEEN
Trich, Wet Prep: NONE SEEN

## 2019-01-25 LAB — PREPARE RBC (CROSSMATCH)

## 2019-01-25 LAB — AMNISURE RUPTURE OF MEMBRANE (ROM) NOT AT ARMC: Amnisure ROM: NEGATIVE

## 2019-01-25 LAB — ABO/RH: ABO/RH(D): O POS

## 2019-01-25 LAB — SARS CORONAVIRUS 2 (TAT 6-24 HRS): SARS Coronavirus 2: NEGATIVE

## 2019-01-25 SURGERY — Surgical Case
Anesthesia: Spinal

## 2019-01-25 MED ORDER — TETANUS-DIPHTH-ACELL PERTUSSIS 5-2.5-18.5 LF-MCG/0.5 IM SUSP: 0.5000 mL | Freq: Once | INTRAMUSCULAR | Status: DC

## 2019-01-25 MED ORDER — PHENYLEPHRINE HCL-NACL 20-0.9 MG/250ML-% IV SOLN
INTRAVENOUS | Status: AC
  Filled 2019-01-25: qty 250

## 2019-01-25 MED ORDER — ACETAMINOPHEN 500 MG PO TABS
1000.0000 mg | ORAL_TABLET | Freq: Three times a day (TID) | ORAL | Status: DC | PRN
  Administered 2019-01-26 – 2019-01-27 (×3): 1000 mg via ORAL
  Filled 2019-01-25 (×3): qty 2

## 2019-01-25 MED ORDER — NALBUPHINE HCL 10 MG/ML IJ SOLN
5.0000 mg | Freq: Once | INTRAMUSCULAR | Status: AC | PRN
  Administered 2019-01-26: 04:00:00 5 mg via SUBCUTANEOUS
  Filled 2019-01-25: qty 1

## 2019-01-25 MED ORDER — COCONUT OIL OIL: 1.0000 "application " | TOPICAL_OIL | Status: DC | PRN

## 2019-01-25 MED ORDER — SODIUM CHLORIDE 0.9% IV SOLUTION: Freq: Once | INTRAVENOUS | Status: DC

## 2019-01-25 MED ORDER — ONDANSETRON HCL 4 MG/2ML IJ SOLN: 4.0000 mg | Freq: Three times a day (TID) | INTRAMUSCULAR | Status: DC | PRN

## 2019-01-25 MED ORDER — FENTANYL CITRATE (PF) 100 MCG/2ML IJ SOLN
INTRAMUSCULAR | Status: AC
  Filled 2019-01-25: qty 2

## 2019-01-25 MED ORDER — MENTHOL 3 MG MT LOZG: 1.0000 | LOZENGE | OROMUCOSAL | Status: DC | PRN

## 2019-01-25 MED ORDER — OXYTOCIN 40 UNITS IN NORMAL SALINE INFUSION - SIMPLE MED
2.5000 [IU]/h | INTRAVENOUS | Status: AC
  Administered 2019-01-25: 2.5 [IU]/h via INTRAVENOUS
  Filled 2019-01-25: qty 1000

## 2019-01-25 MED ORDER — KETOROLAC TROMETHAMINE 30 MG/ML IJ SOLN
INTRAMUSCULAR | Status: AC
  Filled 2019-01-25: qty 1

## 2019-01-25 MED ORDER — OXYTOCIN 40 UNITS IN NORMAL SALINE INFUSION - SIMPLE MED
INTRAVENOUS | Status: AC
  Filled 2019-01-25: qty 1000

## 2019-01-25 MED ORDER — MEPERIDINE HCL 25 MG/ML IJ SOLN
INTRAMUSCULAR | Status: DC | PRN
  Administered 2019-01-25 (×2): 12.5 mg via INTRAVENOUS

## 2019-01-25 MED ORDER — MORPHINE SULFATE (PF) 0.5 MG/ML IJ SOLN
INTRAMUSCULAR | Status: DC | PRN
  Administered 2019-01-25: .15 mg via INTRATHECAL

## 2019-01-25 MED ORDER — DIPHENHYDRAMINE HCL 50 MG/ML IJ SOLN: 12.5000 mg | INTRAMUSCULAR | Status: DC | PRN

## 2019-01-25 MED ORDER — FENTANYL CITRATE (PF) 100 MCG/2ML IJ SOLN: 25.0000 ug | INTRAMUSCULAR | Status: DC | PRN

## 2019-01-25 MED ORDER — PHENYLEPHRINE HCL-NACL 20-0.9 MG/250ML-% IV SOLN
INTRAVENOUS | Status: DC | PRN
  Administered 2019-01-25: 60 ug/min via INTRAVENOUS

## 2019-01-25 MED ORDER — SOD CITRATE-CITRIC ACID 500-334 MG/5ML PO SOLN
30.0000 mL | ORAL | Status: AC
  Administered 2019-01-25: 19:00:00 30 mL via ORAL
  Filled 2019-01-25: qty 30

## 2019-01-25 MED ORDER — ZOLPIDEM TARTRATE 5 MG PO TABS: 5.0000 mg | ORAL_TABLET | Freq: Every evening | ORAL | Status: DC | PRN

## 2019-01-25 MED ORDER — DIPHENHYDRAMINE HCL 50 MG/ML IJ SOLN
INTRAMUSCULAR | Status: DC | PRN
  Administered 2019-01-25: 25 mg via INTRAVENOUS

## 2019-01-25 MED ORDER — PROMETHAZINE HCL 25 MG/ML IJ SOLN: 6.2500 mg | INTRAMUSCULAR | Status: DC | PRN

## 2019-01-25 MED ORDER — DEXAMETHASONE SODIUM PHOSPHATE 4 MG/ML IJ SOLN
INTRAMUSCULAR | Status: DC | PRN
  Administered 2019-01-25: 4 mg via INTRAVENOUS

## 2019-01-25 MED ORDER — BUPIVACAINE HCL (PF) 0.5 % IJ SOLN
INTRAMUSCULAR | Status: DC | PRN
  Administered 2019-01-25: 30 mL

## 2019-01-25 MED ORDER — NALBUPHINE HCL 10 MG/ML IJ SOLN
5.0000 mg | INTRAMUSCULAR | Status: DC | PRN
  Administered 2019-01-26 (×2): 5 mg via INTRAVENOUS
  Filled 2019-01-25 (×2): qty 1

## 2019-01-25 MED ORDER — DIPHENHYDRAMINE HCL 25 MG PO CAPS
25.0000 mg | ORAL_CAPSULE | ORAL | Status: DC | PRN
  Administered 2019-01-25 – 2019-01-26 (×2): 25 mg via ORAL
  Filled 2019-01-25 (×2): qty 1

## 2019-01-25 MED ORDER — MEPERIDINE HCL 25 MG/ML IJ SOLN
INTRAMUSCULAR | Status: AC
  Filled 2019-01-25: qty 1

## 2019-01-25 MED ORDER — SCOPOLAMINE 1 MG/3DAYS TD PT72: 1.0000 | MEDICATED_PATCH | Freq: Once | TRANSDERMAL | Status: DC

## 2019-01-25 MED ORDER — LACTATED RINGERS IV SOLN
INTRAVENOUS | Status: DC
  Administered 2019-01-25 (×2): via INTRAVENOUS

## 2019-01-25 MED ORDER — SIMETHICONE 80 MG PO CHEW
80.0000 mg | CHEWABLE_TABLET | ORAL | Status: DC | PRN
  Administered 2019-01-26: 80 mg via ORAL

## 2019-01-25 MED ORDER — KETOROLAC TROMETHAMINE 30 MG/ML IJ SOLN: 30.0000 mg | Freq: Four times a day (QID) | INTRAMUSCULAR | Status: AC | PRN

## 2019-01-25 MED ORDER — KETOROLAC TROMETHAMINE 30 MG/ML IJ SOLN: 30.0000 mg | Freq: Once | INTRAMUSCULAR | Status: DC | PRN

## 2019-01-25 MED ORDER — NALBUPHINE HCL 10 MG/ML IJ SOLN: 5.0000 mg | INTRAMUSCULAR | Status: DC | PRN

## 2019-01-25 MED ORDER — OXYCODONE HCL 5 MG PO TABS
5.0000 mg | ORAL_TABLET | ORAL | Status: DC | PRN
  Administered 2019-01-27: 14:00:00 5 mg via ORAL
  Filled 2019-01-25: qty 1

## 2019-01-25 MED ORDER — NALOXONE HCL 4 MG/10ML IJ SOLN
1.0000 ug/kg/h | INTRAVENOUS | Status: DC | PRN
  Filled 2019-01-25: qty 5

## 2019-01-25 MED ORDER — DIPHENHYDRAMINE HCL 25 MG PO CAPS: 25.0000 mg | ORAL_CAPSULE | Freq: Four times a day (QID) | ORAL | Status: DC | PRN

## 2019-01-25 MED ORDER — NALOXONE HCL 0.4 MG/ML IJ SOLN: 0.4000 mg | INTRAMUSCULAR | Status: DC | PRN

## 2019-01-25 MED ORDER — SIMETHICONE 80 MG PO CHEW
80.0000 mg | CHEWABLE_TABLET | Freq: Three times a day (TID) | ORAL | Status: DC
  Administered 2019-01-26 – 2019-01-28 (×7): 80 mg via ORAL
  Filled 2019-01-25 (×7): qty 1

## 2019-01-25 MED ORDER — WITCH HAZEL-GLYCERIN EX PADS: 1.0000 | MEDICATED_PAD | CUTANEOUS | Status: DC | PRN

## 2019-01-25 MED ORDER — LACTATED RINGERS IV BOLUS
1000.0000 mL | Freq: Once | INTRAVENOUS | Status: AC
  Administered 2019-01-25: 18:00:00 1000 mL via INTRAVENOUS

## 2019-01-25 MED ORDER — ONDANSETRON HCL 4 MG/2ML IJ SOLN
INTRAMUSCULAR | Status: AC
  Filled 2019-01-25: qty 2

## 2019-01-25 MED ORDER — SODIUM CHLORIDE (PF) 0.9 % IJ SOLN
INTRAMUSCULAR | Status: AC
  Filled 2019-01-25: qty 10

## 2019-01-25 MED ORDER — METOCLOPRAMIDE HCL 5 MG/ML IJ SOLN
INTRAMUSCULAR | Status: DC | PRN
  Administered 2019-01-25: 10 mg via INTRAVENOUS

## 2019-01-25 MED ORDER — BUPIVACAINE IN DEXTROSE 0.75-8.25 % IT SOLN
INTRATHECAL | Status: DC | PRN
  Administered 2019-01-25: 1.4 mL via INTRATHECAL

## 2019-01-25 MED ORDER — ACETAMINOPHEN 500 MG PO TABS
1000.0000 mg | ORAL_TABLET | Freq: Four times a day (QID) | ORAL | Status: AC
  Administered 2019-01-26 (×3): 1000 mg via ORAL
  Filled 2019-01-25 (×3): qty 2

## 2019-01-25 MED ORDER — SODIUM CHLORIDE 0.9 % IR SOLN
Status: DC | PRN
  Administered 2019-01-25: 1

## 2019-01-25 MED ORDER — IBUPROFEN 600 MG PO TABS
600.0000 mg | ORAL_TABLET | Freq: Three times a day (TID) | ORAL | Status: DC | PRN
  Administered 2019-01-26 – 2019-01-28 (×3): 600 mg via ORAL
  Filled 2019-01-25 (×4): qty 1

## 2019-01-25 MED ORDER — BUPIVACAINE HCL (PF) 0.5 % IJ SOLN
INTRAMUSCULAR | Status: AC
  Filled 2019-01-25: qty 30

## 2019-01-25 MED ORDER — NALBUPHINE HCL 10 MG/ML IJ SOLN: 5.0000 mg | Freq: Once | INTRAMUSCULAR | Status: AC | PRN

## 2019-01-25 MED ORDER — CEFAZOLIN SODIUM-DEXTROSE 2-4 GM/100ML-% IV SOLN
INTRAVENOUS | Status: AC
  Filled 2019-01-25: qty 100

## 2019-01-25 MED ORDER — FAMOTIDINE IN NACL 20-0.9 MG/50ML-% IV SOLN
20.0000 mg | Freq: Once | INTRAVENOUS | Status: AC
  Administered 2019-01-25: 19:00:00 20 mg via INTRAVENOUS
  Filled 2019-01-25: qty 50

## 2019-01-25 MED ORDER — LACTATED RINGERS IV SOLN
INTRAVENOUS | Status: DC
  Administered 2019-01-25 – 2019-01-27 (×3): via INTRAVENOUS

## 2019-01-25 MED ORDER — SODIUM CHLORIDE 0.9 % IV SOLN
INTRAVENOUS | Status: AC
  Filled 2019-01-25: qty 500

## 2019-01-25 MED ORDER — LACTATED RINGERS IV SOLN
INTRAVENOUS | Status: DC | PRN
  Administered 2019-01-25: 19:00:00 via INTRAVENOUS

## 2019-01-25 MED ORDER — FENTANYL CITRATE (PF) 100 MCG/2ML IJ SOLN
INTRAMUSCULAR | Status: DC | PRN
  Administered 2019-01-25: 15 ug via INTRATHECAL

## 2019-01-25 MED ORDER — MORPHINE SULFATE (PF) 0.5 MG/ML IJ SOLN
INTRAMUSCULAR | Status: AC
  Filled 2019-01-25: qty 10

## 2019-01-25 MED ORDER — ENOXAPARIN SODIUM 60 MG/0.6ML ~~LOC~~ SOLN
0.5000 mg/kg | SUBCUTANEOUS | Status: DC
  Administered 2019-01-26 – 2019-01-28 (×3): 45.1 mg via SUBCUTANEOUS
  Filled 2019-01-25 (×3): qty 0.6

## 2019-01-25 MED ORDER — SODIUM CHLORIDE 0.9 % IV SOLN
INTRAVENOUS | Status: DC | PRN
  Administered 2019-01-25: 40 [IU] via INTRAVENOUS

## 2019-01-25 MED ORDER — MEPERIDINE HCL 25 MG/ML IJ SOLN: 6.2500 mg | INTRAMUSCULAR | Status: DC | PRN

## 2019-01-25 MED ORDER — SODIUM CHLORIDE 0.9% FLUSH: 3.0000 mL | INTRAVENOUS | Status: DC | PRN

## 2019-01-25 MED ORDER — SIMETHICONE 80 MG PO CHEW
80.0000 mg | CHEWABLE_TABLET | ORAL | Status: DC
  Administered 2019-01-26 – 2019-01-27 (×2): 80 mg via ORAL
  Filled 2019-01-25 (×3): qty 1

## 2019-01-25 MED ORDER — DEXAMETHASONE SODIUM PHOSPHATE 4 MG/ML IJ SOLN
INTRAMUSCULAR | Status: AC
  Filled 2019-01-25: qty 1

## 2019-01-25 MED ORDER — KETOROLAC TROMETHAMINE 30 MG/ML IJ SOLN
30.0000 mg | Freq: Four times a day (QID) | INTRAMUSCULAR | Status: AC | PRN
  Administered 2019-01-25: 30 mg via INTRAMUSCULAR

## 2019-01-25 MED ORDER — SENNOSIDES-DOCUSATE SODIUM 8.6-50 MG PO TABS
2.0000 | ORAL_TABLET | ORAL | Status: DC
  Administered 2019-01-26 – 2019-01-27 (×3): 2 via ORAL
  Filled 2019-01-25 (×3): qty 2

## 2019-01-25 MED ORDER — DIBUCAINE (PERIANAL) 1 % EX OINT: 1.0000 "application " | TOPICAL_OINTMENT | CUTANEOUS | Status: DC | PRN

## 2019-01-25 MED ORDER — ONDANSETRON HCL 4 MG/2ML IJ SOLN
INTRAMUSCULAR | Status: DC | PRN
  Administered 2019-01-25: 4 mg via INTRAVENOUS

## 2019-01-25 MED ORDER — PHENYLEPHRINE HCL (PRESSORS) 10 MG/ML IV SOLN
INTRAVENOUS | Status: DC | PRN
  Administered 2019-01-25: 80 ug via INTRAVENOUS
  Administered 2019-01-25: 120 ug via INTRAVENOUS

## 2019-01-25 MED ORDER — PRENATAL MULTIVITAMIN CH
1.0000 | ORAL_TABLET | Freq: Every day | ORAL | Status: DC
  Administered 2019-01-26 – 2019-01-27 (×2): 1 via ORAL
  Filled 2019-01-25 (×2): qty 1

## 2019-01-25 MED ORDER — CEFAZOLIN SODIUM-DEXTROSE 2-4 GM/100ML-% IV SOLN
2.0000 g | Freq: Once | INTRAVENOUS | Status: DC
  Filled 2019-01-25: qty 100

## 2019-01-25 SURGICAL SUPPLY — 34 items
BENZOIN TINCTURE PRP APPL 2/3 (GAUZE/BANDAGES/DRESSINGS) IMPLANT
CHLORAPREP W/TINT 26ML (MISCELLANEOUS) ×3 IMPLANT
CLAMP CORD UMBIL (MISCELLANEOUS) IMPLANT
CLOSURE WOUND 1/2 X4 (GAUZE/BANDAGES/DRESSINGS)
CLOTH BEACON ORANGE TIMEOUT ST (SAFETY) ×3 IMPLANT
DRSG OPSITE POSTOP 4X10 (GAUZE/BANDAGES/DRESSINGS) ×3 IMPLANT
ELECT REM PT RETURN 9FT ADLT (ELECTROSURGICAL) ×3
ELECTRODE REM PT RTRN 9FT ADLT (ELECTROSURGICAL) ×1 IMPLANT
EXTRACTOR VACUUM BELL STYLE (SUCTIONS) IMPLANT
GLOVE BIOGEL PI IND STRL 6.5 (GLOVE) ×1 IMPLANT
GLOVE BIOGEL PI IND STRL 7.0 (GLOVE) ×2 IMPLANT
GLOVE BIOGEL PI INDICATOR 6.5 (GLOVE) ×2
GLOVE BIOGEL PI INDICATOR 7.0 (GLOVE) ×4
GLOVE ORTHOPEDIC STR SZ6.5 (GLOVE) ×3 IMPLANT
GOWN STRL REUS W/TWL LRG LVL3 (GOWN DISPOSABLE) ×9 IMPLANT
HEMOSTAT ARISTA ABSORB 3G PWDR (HEMOSTASIS) ×3 IMPLANT
KIT ABG SYR 3ML LUER SLIP (SYRINGE) IMPLANT
NEEDLE HYPO 22GX1.5 SAFETY (NEEDLE) ×3 IMPLANT
NEEDLE HYPO 25X1 1.5 SAFETY (NEEDLE) IMPLANT
NS IRRIG 1000ML POUR BTL (IV SOLUTION) ×3 IMPLANT
PACK C SECTION WH (CUSTOM PROCEDURE TRAY) ×3 IMPLANT
PAD OB MATERNITY 4.3X12.25 (PERSONAL CARE ITEMS) ×3 IMPLANT
PENCIL SMOKE EVAC W/HOLSTER (ELECTROSURGICAL) ×3 IMPLANT
STRIP CLOSURE SKIN 1/2X4 (GAUZE/BANDAGES/DRESSINGS) IMPLANT
SUT MON AB 4-0 PS1 27 (SUTURE) ×3 IMPLANT
SUT PLAIN 2 0 (SUTURE) ×2
SUT PLAIN ABS 2-0 CT1 27XMFL (SUTURE) ×1 IMPLANT
SUT VIC AB 0 CT1 36 (SUTURE) ×6 IMPLANT
SUT VIC AB 0 CTX 36 (SUTURE) ×2
SUT VIC AB 0 CTX36XBRD ANBCTRL (SUTURE) ×1 IMPLANT
SYR CONTROL 10ML LL (SYRINGE) ×3 IMPLANT
TOWEL OR 17X24 6PK STRL BLUE (TOWEL DISPOSABLE) ×3 IMPLANT
TRAY FOLEY W/BAG SLVR 14FR LF (SET/KITS/TRAYS/PACK) ×3 IMPLANT
WATER STERILE IRR 1000ML POUR (IV SOLUTION) ×3 IMPLANT

## 2019-01-25 NOTE — Discharge Summary (Signed)
Postpartum Discharge Summary     Patient Name: Dawn Gould DOB: Aug 07, 1990 MRN: [id number]  Date of admission: 01/25/2019 Delivering Provider:    Coleta, Macmurray Darianne [[id number]]  Treonna, Rasnake Luria [[id number]]  Conan Bowens    Date of discharge: 01/28/2019  Admitting diagnosis: contractions leaking fluid Intrauterine pregnancy: [redacted]w[redacted]d     Secondary diagnosis:  Active Problems:   Twin pregnancy, twins dichorionic and diamniotic   Preterm uterine contractions   Malpresentation of fetus, antepartum   Labor and delivery, indication for care  Additional problems: None     Discharge diagnosis: Preterm Pregnancy Delivered                                                                                                Augmentation: None  Complications: None  Hospital course:  Onset of Labor With Unplanned C/S  28 y.o. yo [id number] at [redacted]w[redacted]d was admitted in Latent Labor on 01/25/2019. Patient presented to [facility] reporting leaking fluid and concern for preterm labor. She receives [facility] at a [facility] OB office and last seen on 01/21/19. They decided to come here for re-evaluation and she was scheduled for C/S on 02/18/2019. Patient seen and examined, noted to have made cervical change and is in early labor. Has di/di twins, with Twin A being breech. Reports uneventful pregnancy except for yeast infections, did not go to [employer] as she felt her doctor did not take her leaking fluid seriously earlier this week. As she has made cervical change, will proceed with primary c-section. Also strongly desires BTL. Patient states she signed state BTL papers last week with her OBGYN, these are not available currently, have sent request for info to [place] to get records.    Laylynn, Bracken A Ginni [[id number]]  7:16 PM    Shalene, Tape Su [[id number]]  7:17 PM  ,   Audris, Delzell Catelin [[id number]]  01/25/2019    Pattijo, Ennes Roylene [[id number]]  01/25/2019    The patient went  for cesarean section due to [facility] and breech presentation of Twin A, and delivered a 2 Viable infants,   Rasheedah, Limback Wanell [[id number]]  01/25/2019    Vaughan, Etcitty Catalina [[id number]]  01/25/2019   Details of operation can be found in separate operative note. Patient had an uncomplicated postpartum course.  She is ambulating,tolerating a regular diet, passing flatus, and urinating well.  Patient is discharged home in stable condition 01/28/19. Patient desires to follow up with [facility]  Magnesium Sulfate recieved: No BMZ received: No  Physical exam  Vitals:   01/27/19 1905 01/27/19 2259 01/28/19 0428 01/28/19 0800  BP: 126/75 127/70 (!) 147/84 123/81  Pulse: 75 68 72 67  Resp: 18 18 18 18   Temp: 98.8 F (37.1 C) 97.9 F (36.6 C) 98.1 F (36.7 C) 98.5 F (36.9 C)  TempSrc: Oral Oral Oral Oral  SpO2: 100% 100% 100% 100%  Weight:      Height:       General: alert, cooperative and no distress Lochia: appropriate Uterine Fundus: firm Incision: Dressing is  clean, dry, and intact DVT Evaluation: No evidence of DVT seen on physical exam. Labs: Lab Results  Component Value Date   WBC 13.4 (H) 01/26/2019   HGB 7.6 (L) 01/26/2019   HCT 24.9 (L) 01/26/2019   MCV 80.6 01/26/2019   PLT 197 01/26/2019   CMP Latest Ref Rng & Units 01/26/2019  Creatinine 0.44 - 1.00 mg/dL 1.61    Discharge instruction: per After Visit Summary and "Baby and Me Booklet".  After visit meds:  Allergies as of 01/28/2019      Reactions   Tramadol    Hot flash      Medication List    STOP taking these medications   benzonatate 100 MG capsule Commonly known as: TESSALON   dextromethorphan-guaiFENesin 30-600 MG 12hr tablet Commonly known as: MUCINEX DM   fluticasone 50 MCG/ACT nasal spray Commonly known as: FLONASE   HYDROcodone-acetaminophen 5-325 MG tablet Commonly known as: NORCO/VICODIN   ondansetron 4 MG disintegrating tablet Commonly known as: Zofran ODT   oseltamivir 75 MG  capsule Commonly known as: TAMIFLU   penicillin v potassium 500 MG tablet Commonly known as: VEETID     TAKE these medications   ibuprofen 600 MG tablet Commonly known as: ADVIL Take 1 tablet (600 mg total) by mouth every 6 (six) hours as needed for fever or headache. What changed: reasons to take this   oxyCODONE 5 MG immediate release tablet Commonly known as: Oxy IR/ROXICODONE Take 1-2 tablets (5-10 mg total) by mouth every 4 (four) hours as needed for moderate pain.   prenatal multivitamin Tabs tablet Take 1 tablet by mouth daily at 12 noon.       Diet: routine diet  Activity: Advance as tolerated. Pelvic rest for 6 weeks.   Outpatient follow up:4 weeks Follow up Appt: Future Appointments  Date Time Provider Department Center  02/13/2019  1:30 PM [facility] NURSE [facility]  02/28/2019 10:35 AM Conan Bowens, MD [facility]   Follow up Visit:   Please schedule this patient for Postpartum visit in: 4 weeks with the following provider: Any provider For C/S patients schedule nurse incision check in weeks 2 weeks: yes High risk pregnancy complicated by: multigestation Delivery mode:  CS Anticipated Birth Control:  BTL done PP PP Procedures needed: Incision check  Schedule Integrated BH visit: no      Newborn Data:   Jaleesha, Buckel Osiris [[id number]]  Live born female  Birth Weight: 5 lb 4.3 oz (2390 g) APGAR: 8, 9  Newborn Delivery   Birth date/time: 01/25/2019 19:17:00 Delivery type: C-Section, Low Transverse Trial of labor: No C-section categorization: Primary       Maris, Medearis Lonni [[id number]]  Live born female  Birth Weight: 5 lb 5 oz (2410 g) APGAR: 8, 9  Newborn Delivery   Birth date/time: 01/25/2019 19:18:00 Delivery type:       Baby Feeding: Breast Disposition:NICU   01/28/2019 Catalina Antigua, MD

## 2019-01-25 NOTE — Anesthesia Postprocedure Evaluation (Signed)
Anesthesia Post Note  Patient: Dawn Gould  Procedure(s) Performed: CESAREAN SECTION (N/A )     Patient location during evaluation: PACU Anesthesia Type: Spinal Level of consciousness: awake Pain management: pain level controlled Vital Signs Assessment: post-procedure vital signs reviewed and stable Respiratory status: spontaneous breathing Cardiovascular status: stable Postop Assessment: no backache, no headache, spinal receding, patient able to bend at knees and no apparent nausea or vomiting Anesthetic complications: no    Last Vitals:  Vitals:   01/25/19 2115 01/25/19 2150  BP: 108/69 115/66  Pulse: 73 79  Resp: 20 18  Temp: 36.6 C 36.9 C  SpO2: 100% 98%    Last Pain:  Vitals:   01/25/19 2153  TempSrc:   PainSc: 4    Pain Goal:                Epidural/Spinal Function Cutaneous sensation: Normal sensation (01/25/19 2153), Patient able to flex knees: Yes (01/25/19 2153), Patient able to lift hips off bed: Yes (01/25/19 2153), Back pain beyond tenderness at insertion site: No (01/25/19 2153), Progressively worsening motor and/or sensory loss: No (01/25/19 2153), Bowel and/or bladder incontinence post epidural: No (01/25/19 2153)  Huston Foley

## 2019-01-25 NOTE — Transfer of Care (Signed)
Immediate Anesthesia Transfer of Care Note  Patient: Dawn Gould  Procedure(s) Performed: CESAREAN SECTION (N/A )  Patient Location: PACU  Anesthesia Type:Spinal  Level of Consciousness: awake, alert  and oriented  Airway & Oxygen Therapy: Patient Spontanous Breathing  Post-op Assessment: Report given to RN and Post -op Vital signs reviewed and stable  Post vital signs: Reviewed and stable  Last Vitals:  Vitals Value Taken Time  BP    Temp    Pulse    Resp    SpO2      Last Pain:  Vitals:   01/25/19 1755  TempSrc: Oral  PainSc:          Complications: No apparent anesthesia complications

## 2019-01-25 NOTE — Op Note (Addendum)
Dawn Gould PROCEDURE DATE: 01/25/2019  PREOPERATIVE DIAGNOSES: Intrauterine pregnancy at [redacted]w[redacted]d weeks gestation; multigestation (di-di twin) with breech presentation of Twin A.; undesired fertility  POSTOPERATIVE DIAGNOSES: The same  PROCEDURE: Primary Low Transverse Cesarean Section, Bilateral Tubal Sterilization using Pomeroy method  SURGEON:  Dr. Sloan Gould  ASSISTANT:  Dawn Ellison MD  An experienced assistant was required given the standard of surgical care given the complexity of the case.  This assistant was needed for exposure, dissection, suctioning, retraction, instrument exchange, assisting with delivery with administration of fundal pressure, and for overall help during the procedure.   ANESTHESIOLOGIST: Dr. Jillyn Gould   INDICATIONS: Dawn Gould is a 28 y.o. [phone] at [redacted]w[redacted]d here for cesarean section and bilateral tubal sterilization secondary to the indications listed under preoperative diagnoses; please see preoperative note for further details.  The risks of surgery were discussed with the patient including but were not limited to: bleeding which may require transfusion or reoperation; infection which may require antibiotics; injury to bowel, bladder, ureters or other surrounding organs; injury to the fetus; need for additional procedures including hysterectomy in the event of a life-threatening hemorrhage; placental abnormalities wth subsequent pregnancies, incisional problems, thromboembolic phenomenon and other postoperative/anesthesia complications.  Patient also desires permanent sterilization.  Other reversible forms of contraception were discussed with patient; she declines all other modalities. Risks of procedure discussed with patient including but not limited to: risk of regret, permanence of method, bleeding, infection, injury to surrounding organs and need for additional procedures.  Failure risk of 1-2% with increased risk of ectopic gestation if pregnancy occurs was  also discussed with patient.  Also discussed possibility of post-tubal pain syndrome. The patient concurred with the proposed plan, giving informed written consent for the procedures.    FINDINGS:  Two viable female infants. Twin A in breech presentation and Twin B in transverse presentation prior to delivery. Both infants delivered in breech presentation. Clear amniotic fluid.  Intact placenta, three vessel cord.  Normal uterus and fallopian tubes bilaterally. Paratubal cyst on right side lysed, otherwise ovaries normal bilaterally. Fallopian tubes were sterilized bilaterally. APGAR (1 MIN):    Dawn Gould [[id number]]  0    Dawn Gould [[id number]]  8    APGAR (5 MINS):    Dawn Gould [[id number]]  4    Dawn Gould [[id number]]  9    APGAR (10 MINS):    Dawn Gould [[id number]]     Dawn Gould [[id number]]     ANESTHESIA: Spinal INTRAVENOUS FLUIDS: 2700 ml ESTIMATED BLOOD LOSS: 589 ml URINE OUTPUT:  500 ml SPECIMENS: Placenta sent to L&D and bilateral fallopian tube segments also sent to pathology COMPLICATIONS: None immediate  PROCEDURE IN DETAIL:  The patient preoperatively received intravenous antibiotics and had sequential compression devices applied to her lower extremities.   She was then taken to the operating room where spinal anesthesia was administered and was found to be adequate. She was then placed in a dorsal supine position with a leftward tilt, and prepped and draped in a sterile manner.  A foley catheter was placed into her bladder and attached to constant gravity.  After an adequate timeout was performed, a Pfannenstiel skin incision was made with scalpel and carried through to the underlying layer of fascia. The fascia was incised in the midline, and this incision was extended bilaterally using the Mayo scissors.  Kocher clamps were applied to the superior aspect of the fascial incision and the underlying rectus  muscles were  dissected off bluntly.  A similar process was carried out on the inferior aspect of the fascial incision. The rectus muscles were separated in the midline and the peritoneum was entered bluntly. The Alexis self-retaining retractor was introduced into the abdominal cavity.  Bladder flap created and bladder blade inserted. Attention was turned to the lower uterine segment where a low transverse hysterotomy was made with a scalpel and extended bilaterally bluntly.  Twin A successfully delivered in breech presentation, the hips were lifted from the pelvis and gently delivered, the infant torso was wrapped with a moist towel and downward traction used until the shoulders delivered. The torso was rotation until each arm could be swept free and the head was subsequently delivered easily. The cord was clamped and cut immediately and the infant was handed over to the awaiting neonatology team. Twin B was also successfully delivered in breech presentation in a similar fashion except feet were delivered first, then torso and each arm easily swept out, the head delivered easily. The cord was clamped and cut immediately and the infant was handed over to the awaiting neonatology team. Cord blood collected times 2. Uterine massage was then administered, and the placenta delivered intact with a three-vessel cord. The uterus was then cleared of clots and debris.  The hysterotomy was closed with 0 Vicryl in a running locked fashion, and an imbricating layer was also placed with 0 Vicryl.  Figure-of-eight 0 Vicryl serosal stitches were placed to help with hemostasis.    Attention was then turned to the fallopian tubes, Babcock clamp was then used to grasp the right fallopian tube approximately 3 cm from the cornual region. A 3 cm segment of the tube was then doubly ligated with free tie of 2-0 plain gut suture, transected and excised. Small amount of bleeding noted at the pedicle and a third free tie was placed around to the pedicle  to good effect with hemostasis noted. Right paratubal cyst lysed. The left fallopian tube was then identified, doubly ligated, and a 3 cm segment excised in a similar fashion allowing for bilateral tubal sterilization. The excised tubes were sent to pathology and re-visualization of the pedicles demonstrated hemostasis. The pelvis was cleared of all clot and debris. Hemostasis was confirmed on all surfaces and Arista placed over the hysterotomy for additional hemostasis.  The retractor was removed.  The peritoneum was closed with a 2-0 Vicryl running stitch. The fascia was then closed using 0 Vicryl in a running fashion.  The subcutaneous layer was irrigated, reapproximated with 2-0 plain gut interrupted stitches, and the skin was closed with a 4-0 Vicryl subcuticular stitch. The patient tolerated the procedure well. Sponge, instrument and needle counts were correct x 3.  She was taken to the recovery room in stable condition.   Jerilynn Birkenheadhelsea Fair, MD OB Family Medicine Fellow, [facility] for [facility], [facility]    Attestation of Attending Supervision of [place] Fellow: Evaluation, management, and procedures were performed by the [facility] under my supervision and collaboration. I was scrubbed and present for all portions of this procedure. I agree with the documentation and plan.  Baldemar LenisK. Meryl Davis, M.D. Attending Center for [facility] (Faculty Practice)  01/25/2019 8:59 PM

## 2019-01-25 NOTE — Consult Note (Addendum)
Neonatology Note:   Attendance at C-section:    I was asked by Dr. Rosana Hoes to attend this C/S at 34.3 weeks due to Twin A breech positioning with mother in PTL. The mother is a [id number], GBS unk with good prenatal care of didi twins at a [facility] in [place]. Also, tobacco smoker.  Elected to deliver here instead (see OB note).  ROM unclear.  Amnisure negative but she c/o leaking white and clear fluid since 01/19/19.    Twin A:  Fluid clear. Breech. Infant vigorous with good spontaneous cry and tone. Needed only minimal bulb suctioning. Ap 8/9. Lungs clear to ausc in DR. To NICU for care of prematurity. Transported without issues on RA.   Twin B:  Fluid clear. Infant vigorous with good spontaneous cry and tone. Needed bulb suctioning. Slow color change with gradual development of wob.  SaO2 applied and in 60s. CPAP initiated and fio2 adjusted accordingly with overall clinical improvements. Ap 8/9. Lungs coarse and equal to ausc in DR. To NICU for care of prematurity. Transported without issues on RA and well saturated though noted mild developing wob.  Monia Sabal Katherina Mires, MD

## 2019-01-25 NOTE — MAU Provider Note (Signed)
History     CSN: [id number]  Arrival date and time: 01/25/19 1042   First Provider Initiated Contact with Patient 01/25/19 1143      Chief Complaint  Patient presents with  . Contractions  . Rupture of Membranes   HPI  [name] is a [age] y.o. year old [id number] female at 6995w3d weeks gestation who presents to MAU reporting she has been having UC's and leaking clear-white fluid since Sunday 01/19/19. She reports her UC's are every 2-4 mins. She is pregnant with twins and not sure of their position. [facility] and last seen on 01/21/19. She states "they didn't even check out the leaking fluid at my appt, but they told me I was 2 cm." She woke up this morning with a large spot of clear fluid on her sheets. She denies any odor or itching. She reports she has "suffered with lots of yeast infections." She and the FOB express their frustrations with the care they received there and they "can't believe they sent her home dilated 2 cm." So they decided to come here for re-evaluation. She is scheduled for C/S on 02/18/2019.  Past Medical History:  Diagnosis Date  . Asthma   . Back pain     Past Surgical History:  Procedure Laterality Date  . NO PAST SURGERIES      Family History  Problem Relation Age of Onset  . Hypertension Mother   . Diabetes Paternal Uncle     Social History   Tobacco Use  . Smoking status: Current Every Day Smoker    Types: Cigarettes  . Smokeless tobacco: Never Used  Substance Use Topics  . Alcohol use: No  . Drug use: No    Allergies:  Allergies  Allergen Reactions  . Tramadol     Hot flash    Medications Prior to Admission  Medication Sig Dispense Refill Last Dose  . benzonatate (TESSALON) 100 MG capsule Take 1 capsule (100 mg total) by mouth every 8 (eight) hours. 21 capsule 0   . dextromethorphan-guaiFENesin (MUCINEX DM) 30-600 MG 12hr tablet Take 1 tablet by mouth 2 (two) times daily as needed for cough. 12  tablet 0   . fluticasone (FLONASE) 50 MCG/ACT nasal spray Place 2 sprays into both nostrils daily. 1 g 0   . HYDROcodone-acetaminophen (NORCO/VICODIN) 5-325 MG tablet Take 1-2 tablets by mouth every 4 (four) hours as needed for moderate pain or severe pain. 12 tablet 0   . ibuprofen (ADVIL,MOTRIN) 600 MG tablet Take 1 tablet (600 mg total) by mouth every 6 (six) hours as needed. 30 tablet 0   . ondansetron (ZOFRAN ODT) 4 MG disintegrating tablet Take 1 tablet (4 mg total) by mouth every 8 (eight) hours as needed for nausea or vomiting. 10 tablet 0   . oseltamivir (TAMIFLU) 75 MG capsule Take 1 capsule (75 mg total) by mouth every 12 (twelve) hours. 10 capsule 0   . penicillin v potassium (VEETID) 500 MG tablet Take 2 tablets (1,000 mg total) by mouth 2 (two) times daily. X 7 days 28 tablet 0     Review of Systems  Constitutional: Negative.   HENT: Negative.   Eyes: Negative.   Respiratory: Negative.   Cardiovascular: Negative.   Gastrointestinal: Negative.   Endocrine: Negative.   Genitourinary: Positive for pelvic pain (UC's since 8/16) and vaginal discharge (leaking clear and white fluid since 8/16).  Musculoskeletal: Negative.   Skin: Negative.   Allergic/Immunologic:  Negative.   Neurological: Negative.   Hematological: Negative.   Psychiatric/Behavioral: Negative.    Physical Exam   Blood pressure 120/74, pulse 80, temperature 98.2 F (36.8 C), temperature source Oral, resp. rate 16, height 5\' 3"  (1.6 m), weight 90.2 kg, SpO2 100 %.  Physical Exam  Constitutional: She is oriented to person, place, and time. She appears well-developed and well-nourished.  HENT:  Head: Normocephalic and atraumatic.  Eyes: Pupils are equal, round, and reactive to light.  Neck: Normal range of motion.  Cardiovascular: Normal rate and regular rhythm.  Respiratory: Effort normal.  GI: Soft.  Genitourinary:    Genitourinary Comments: Uterus: gravid, S>D, SE: cervix is smooth, pink, no lesions,  moderate amt of watery, white vaginal d/c -- WP and Amnisure done Dilation: 3 Effacement (%): 90 Cervical Position: Middle Station: -2 Presentation: Breech Exam by:R. Arita Missawson CNM    Musculoskeletal: Normal range of motion.        General: Edema (2+ pitting BLE) present.  Neurological: She is alert and oriented to person, place, and time. She has normal reflexes.  Skin: Skin is warm and dry.  Psychiatric: She has a normal mood and affect. Her behavior is normal. Judgment and thought content normal.   NST - FHR (A): 145 bpm / moderate variability / accels present / decels absent / FHR (B): 140 bpm / moderate variability / accels present / decels absent / TOCO: regular every 7-7.5 mins   1700 NST - FHR (A): 145 bpm / moderate variability / accels present / decels absent / FHR (B): 140 bpm / moderate variability / accels present / decels absent / TOCO: regular every 3-4 mins   MAU Course  Procedures  MDM CCUA CEFM Speculum Exam Amnisure Wet Prep Clear Liquids only after U/S OB Urine Culture -- pending CBC T&S RPR Rapid HIV Rapid SARs testing  *Consult with Dr. Earlene Plateravis @ 1755 - notified of patient's complaints, assessments, lab, NST & U/S results, recommended tx plan proceed to OR for C/S  Results for orders placed or performed during the hospital encounter of 01/25/19 (from the past 24 hour(s))  Urinalysis, Routine w reflex microscopic     Status: Abnormal   Collection Time: 01/25/19 11:13 AM  Result Value Ref Range   Color, Urine YELLOW YELLOW   APPearance HAZY (A) CLEAR   Specific Gravity, Urine 1.010 1.005 - 1.030   pH 7.0 5.0 - 8.0   Glucose, UA NEGATIVE NEGATIVE mg/dL   Hgb urine dipstick NEGATIVE NEGATIVE   Bilirubin Urine NEGATIVE NEGATIVE   Ketones, ur 5 (A) NEGATIVE mg/dL   Protein, ur NEGATIVE NEGATIVE mg/dL   Nitrite NEGATIVE NEGATIVE   Leukocytes,Ua LARGE (A) NEGATIVE   RBC / HPF 0-5 0 - 5 RBC/hpf   WBC, UA 11-20 0 - 5 WBC/hpf   Bacteria, UA RARE (A) NONE  SEEN   Squamous Epithelial / LPF 6-10 0 - 5  Amnisure rupture of membrane (rom)[facility]     Status: None   Collection Time: 01/25/19 12:06 PM  Result Value Ref Range   Amnisure ROM NEGATIVE   Wet prep, genital     Status: Abnormal   Collection Time: 01/25/19 12:06 PM  Result Value Ref Range   Yeast Wet Prep HPF POC PRESENT (A) NONE SEEN   Trich, Wet Prep NONE SEEN NONE SEEN   Clue Cells Wet Prep HPF POC NONE SEEN NONE SEEN   WBC, Wet Prep HPF POC MANY (A) NONE SEEN   Sperm NONE SEEN  Assessment and Plan  Breech presentation of fetus palpable vaginally, fetus 1 of multiple gestation Twin pregnancy  Preterm uterine contractions in third trimester, antepartum - with cervical change - Prepare for OR - Perform COVID-19 screening - LR 1000 ml by IV - Draw admission labs - Dr. Rosana Hoes in to see patient at 1800 discussed R/B of primary C/S of twins and in breech position - This H&P submitted on behalf of Dr. Rosana Hoes.  Laury Deep, MSN, CNM 01/25/2019, 11:44 AM

## 2019-01-25 NOTE — MAU Note (Signed)
Release of PHI request obtained per CNM and faxed to [facility] in [place] (Fax: [phone]).

## 2019-01-25 NOTE — Anesthesia Procedure Notes (Signed)
Spinal  Patient location during procedure: OR Start time: 01/25/2019 6:50 PM Staffing Anesthesiologist: Lyn Hollingshead, MD Performed: anesthesiologist  Preanesthetic Checklist Completed: patient identified, site marked, surgical consent, pre-op evaluation, timeout performed, IV checked, risks and benefits discussed and monitors and equipment checked Spinal Block Patient position: sitting Prep: site prepped and draped and DuraPrep Patient monitoring: continuous pulse ox and blood pressure Approach: midline Location: L3-4 Injection technique: single-shot Needle Needle type: Pencan  Needle gauge: 24 G Needle length: 10 cm Needle insertion depth: 5 cm Assessment Sensory level: T4

## 2019-01-25 NOTE — MAU Note (Signed)
Dawn Gould is a 28 y.o. at [redacted]w[redacted]d here in MAU reporting: has been getting care through [facility], scheduled c/s for 02/18/19. Has been having contractions and leaking fluid since last Sunday. Was at the office on Tuesday and she said she was 2cm at that appointment but they did not assess her LOF. This AM woke up and saw a large spot of fluid on her bed. States no odor or itching, fluid is clear. Ctx every 2-4 min. Having twins. +FM  Onset of complaint: ongoing  Pain score: 8/10  Vitals:   01/25/19 1110  BP: 128/76  Pulse: 85  Resp: 16  Temp: 98.9 F (37.2 C)  SpO2: 100%     FHT: +FM  Lab orders placed from triage: UA

## 2019-01-25 NOTE — H&P (Addendum)
History     CSN: [id number]  Arrival date and time: 01/25/19 1042   First Provider Initiated Contact with Patient 01/25/19 1143      Chief Complaint  Patient presents with  . Contractions  . Rupture of Membranes   HPI  [name] is a 28 y.o. year old G6P1001 female at [redacted]w[redacted]d weeks gestation who presents to MAU reporting she has been having UC's and leaking clear-white fluid since Sunday 01/19/19. She reports her UC's are every 2-4 mins. She is pregnant with twins and not sure of their position. She receives [facility] at a [facility] OB office and last seen on 01/21/19. She states "they didn't even check out the leaking fluid at my appt, but they told me I was 2 cm." She woke up this morning with a large spot of clear fluid on her sheets. She denies any odor or itching. She reports she has "suffered with lots of yeast infections." She and the FOB express their frustrations with the care they received there and they "can't believe they sent her home dilated 2 cm." So they decided to come here for re-evaluation. She is scheduled for C/S on 02/18/2019.  Past Medical History:  Diagnosis Date  . Asthma   . Back pain     Past Surgical History:  Procedure Laterality Date  . NO PAST SURGERIES      Family History  Problem Relation Age of Onset  . Hypertension Mother   . Diabetes Paternal Uncle     Social History   Tobacco Use  . Smoking status: Current Every Day Smoker    Types: Cigarettes  . Smokeless tobacco: Never Used  Substance Use Topics  . Alcohol use: No  . Drug use: No    Allergies:  Allergies  Allergen Reactions  . Tramadol     Hot flash    Medications Prior to Admission  Medication Sig Dispense Refill Last Dose  . benzonatate (TESSALON) 100 MG capsule Take 1 capsule (100 mg total) by mouth every 8 (eight) hours. 21 capsule 0   . dextromethorphan-guaiFENesin (MUCINEX DM) 30-600 MG 12hr tablet Take 1 tablet by mouth 2 (two) times daily as needed for cough. 12  tablet 0   . fluticasone (FLONASE) 50 MCG/ACT nasal spray Place 2 sprays into both nostrils daily. 1 g 0   . HYDROcodone-acetaminophen (NORCO/VICODIN) 5-325 MG tablet Take 1-2 tablets by mouth every 4 (four) hours as needed for moderate pain or severe pain. 12 tablet 0   . ibuprofen (ADVIL,MOTRIN) 600 MG tablet Take 1 tablet (600 mg total) by mouth every 6 (six) hours as needed. 30 tablet 0   . ondansetron (ZOFRAN ODT) 4 MG disintegrating tablet Take 1 tablet (4 mg total) by mouth every 8 (eight) hours as needed for nausea or vomiting. 10 tablet 0   . oseltamivir (TAMIFLU) 75 MG capsule Take 1 capsule (75 mg total) by mouth every 12 (twelve) hours. 10 capsule 0   . penicillin v potassium (VEETID) 500 MG tablet Take 2 tablets (1,000 mg total) by mouth 2 (two) times daily. X 7 days 28 tablet 0     Review of Systems  Constitutional: Negative.   HENT: Negative.   Eyes: Negative.   Respiratory: Negative.   Cardiovascular: Negative.   Gastrointestinal: Negative.   Endocrine: Negative.   Genitourinary: Positive for pelvic pain (UC's since 8/16) and vaginal discharge (leaking clear and white fluid since 8/16).  Musculoskeletal: Negative.   Skin: Negative.  Allergic/Immunologic: Negative.   Neurological: Negative.   Hematological: Negative.   Psychiatric/Behavioral: Negative.    Physical Exam   Blood pressure 120/74, pulse 80, temperature 98.2 F (36.8 C), temperature source Oral, resp. rate 16, height 5\' 3"  (1.6 m), weight 90.2 kg, SpO2 100 %.  Physical Exam  Constitutional: She is oriented to person, place, and time. She appears well-developed and well-nourished.  HENT:  Head: Normocephalic and atraumatic.  Eyes: Pupils are equal, round, and reactive to light.  Neck: Normal range of motion.  Cardiovascular: Normal rate and regular rhythm.  Respiratory: Effort normal.  GI: Soft.  Genitourinary:    Genitourinary Comments: Uterus: gravid, S>D, SE: cervix is smooth, pink, no lesions,  moderate amt of watery, white vaginal d/c -- WP and Amnisure done Dilation: 3 Effacement (%): 90 Cervical Position: Middle Station: -2 Presentation: Breech Exam by:R. Arita Missawson CNM    Musculoskeletal: Normal range of motion.        General: Edema (2+ pitting BLE) present.  Neurological: She is alert and oriented to person, place, and time. She has normal reflexes.  Skin: Skin is warm and dry.  Psychiatric: She has a normal mood and affect. Her behavior is normal. Judgment and thought content normal.   NST - FHR (A): 145 bpm / moderate variability / accels present / decels absent / FHR (B): 140 bpm / moderate variability / accels present / decels absent / TOCO: regular every 7-7.5 mins   1700 NST - FHR (A): 145 bpm / moderate variability / accels present / decels absent / FHR (B): 140 bpm / moderate variability / accels present / decels absent / TOCO: regular every 3-4 mins   MAU Course  Procedures  MDM CCUA CEFM Speculum Exam Amnisure Wet Prep OB F/U MFM U/S with addt'l gestation -  Clear Liquids only after U/S OB Urine Culture -- pending CBC T&S RPR Rapid HIV Rapid SARs testing  *Consult with Dr. Earlene Plateravis @ 1755 - notified of patient's complaints, assessments, lab, NST & U/S results, recommended tx plan proceed to OR for C/S  Results for orders placed or performed during the hospital encounter of 01/25/19 (from the past 24 hour(s))  Urinalysis, Routine w reflex microscopic     Status: Abnormal   Collection Time: 01/25/19 11:13 AM  Result Value Ref Range   Color, Urine YELLOW YELLOW   APPearance HAZY (A) CLEAR   Specific Gravity, Urine 1.010 1.005 - 1.030   pH 7.0 5.0 - 8.0   Glucose, UA NEGATIVE NEGATIVE mg/dL   Hgb urine dipstick NEGATIVE NEGATIVE   Bilirubin Urine NEGATIVE NEGATIVE   Ketones, ur 5 (A) NEGATIVE mg/dL   Protein, ur NEGATIVE NEGATIVE mg/dL   Nitrite NEGATIVE NEGATIVE   Leukocytes,Ua LARGE (A) NEGATIVE   RBC / HPF 0-5 0 - 5 RBC/hpf   WBC, UA 11-20 0 - 5  WBC/hpf   Bacteria, UA RARE (A) NONE SEEN   Squamous Epithelial / LPF 6-10 0 - 5  Amnisure rupture of membrane (rom)not [facility]     Status: None   Collection Time: 01/25/19 12:06 PM  Result Value Ref Range   Amnisure ROM NEGATIVE   Wet prep, genital     Status: Abnormal   Collection Time: 01/25/19 12:06 PM  Result Value Ref Range   Yeast Wet Prep HPF POC PRESENT (A) NONE SEEN   Trich, Wet Prep NONE SEEN NONE SEEN   Clue Cells Wet Prep HPF POC NONE SEEN NONE SEEN   WBC, Wet Prep HPF  POC MANY (A) NONE SEEN   Sperm NONE SEEN       Assessment and Plan  Breech presentation of fetus palpable vaginally, fetus 1 of multiple gestation Twin pregnancy  Preterm uterine contractions in third trimester, antepartum - with cervical change - Prepare for OR - Perform COVID-19 screening - LR 1000 ml by IV - Draw admission labs - Dr. Earlene Plateravis in to see patient at 1800 discussed R/B of primary C/S of twins and in breech position - This H&P submitted on behalf of Dr. Earlene Plateravis.  Raelyn Moraolitta Dawson, MSN, CNM 01/25/2019, 6:00 PM     Attestation of Attending Supervision of Advanced Practitioner (PA/CNM/NP): Evaluation, management, and procedures were performed by the Advanced Practitioner under my supervision and collaboration.  I have reviewed the Advanced Practitioner's note and chart, and I agree with the management and plan.  Patient seen and examined, noted to have made cervical change and is in early labor. Has di/di twins, with Twin A being breech. Reports uneventful pregnancy except for yeast infections, [facility] as she felt her doctor did not take her leaking fluid seriously earlier this week. As she has made cervical change, will proceed with primary c-section. Also strongly desires BTL. Patient states she signed state BTL papers last week with her OBGYN, these are not available currently, have sent request for info to [facility] to get records.  The risks of cesarean section were  discussed with the patient; including but not limited to: infection which may require antibiotics; bleeding which may require transfusion or re-operation; injury to bowel, bladder, ureters or other surrounding organs; need for additional procedures in the event of a life-threatening hemorrhage; placental abnormalities wth subsequent pregnancies, risk of needing c-sections in future pregnancies, incisional problems, thromboembolic phenomenon and other postoperative/anesthesia complications. Answered all questions. The patient verbalized understanding of the plan, giving informed consent for the procedure. She is agreeable to blood transfusion in the event of emergency.  She affirms desire to have bilateral tubal ligation done. She understands that this is a permanent procedure and she will not be able to have children after it is done. Reviewed risks of bilateral tubal ligation including infection, hemorrhage, damage to surrounding tissue and organs, risk of regret. Patient strongly affirms she does not want to have any more children. Reviewed that bilateral tubal ligation is not 100% effective and she should take a pregnancy test if she believes for any reason she may be pregnant. Reviewed slightly increased risk of ectopic pregnancy and need to seek care if she becomes pregnant. She understands this is an elective procedure and again affirms her desire. Consent signed, also consents to blood transfusion if necessary.   Patient has been NPO since 11 pm, she will remain NPO for procedure Anesthesia and OR aware Preoperative prophylactic antibiotics and SCDs ordered on call to the OR  To OR when ready   K. Therese SarahMeryl Aoki Wedemeyer, M.D. Attending Center for [facility] (Faculty Practice)  01/25/2019 6:14 PM

## 2019-01-25 NOTE — Anesthesia Preprocedure Evaluation (Signed)
Anesthesia Evaluation  Patient identified by MRN, date of birth, ID band Patient awake    Reviewed: Allergy & Precautions, H&P , NPO status , Patient's Chart, lab work & pertinent test results  Airway Mallampati: II  TM Distance: >3 FB Neck ROM: full    Dental no notable dental hx. (+) Teeth Intact   Pulmonary Current SmokerPatient did not abstain from smoking.,    Pulmonary exam normal breath sounds clear to auscultation       Cardiovascular negative cardio ROS Normal cardiovascular exam Rhythm:regular Rate:Normal     Neuro/Psych negative neurological ROS  negative psych ROS   GI/Hepatic negative GI ROS, Neg liver ROS,   Endo/Other  negative endocrine ROS  Renal/GU negative Renal ROS  negative genitourinary   Musculoskeletal   Abdominal (+) + obese,   Peds  Hematology negative hematology ROS (+)   Anesthesia Other Findings   Reproductive/Obstetrics (+) Pregnancy                             Anesthesia Physical Anesthesia Plan  ASA: II  Anesthesia Plan: Spinal   Post-op Pain Management:    Induction:   PONV Risk Score and Plan: 2 and Ondansetron, Dexamethasone and Scopolamine patch - Pre-op  Airway Management Planned: Nasal Cannula and Natural Airway  Additional Equipment:   Intra-op Plan:   Post-operative Plan:   Informed Consent: I have reviewed the patients History and Physical, chart, labs and discussed the procedure including the risks, benefits and alternatives for the proposed anesthesia with the patient or authorized representative who has indicated his/her understanding and acceptance.       Plan Discussed with: CRNA  Anesthesia Plan Comments:         Anesthesia Quick Evaluation

## 2019-01-26 ENCOUNTER — Encounter (HOSPITAL_COMMUNITY): Payer: Self-pay | Admitting: Obstetrics and Gynecology

## 2019-01-26 LAB — CBC
HCT: 24.9 % — ABNORMAL LOW (ref 36.0–46.0)
Hemoglobin: 7.6 g/dL — ABNORMAL LOW (ref 12.0–15.0)
MCH: 24.6 pg — ABNORMAL LOW (ref 26.0–34.0)
MCHC: 30.5 g/dL (ref 30.0–36.0)
MCV: 80.6 fL (ref 80.0–100.0)
Platelets: 197 10*3/uL (ref 150–400)
RBC: 3.09 MIL/uL — ABNORMAL LOW (ref 3.87–5.11)
RDW: 15.6 % — ABNORMAL HIGH (ref 11.5–15.5)
WBC: 13.4 10*3/uL — ABNORMAL HIGH (ref 4.0–10.5)
nRBC: 0 % (ref 0.0–0.2)

## 2019-01-26 LAB — RPR: RPR Ser Ql: NONREACTIVE

## 2019-01-26 LAB — CREATININE, SERUM
Creatinine, Ser: 0.6 mg/dL (ref 0.44–1.00)
GFR calc Af Amer: >60 mL/min (ref >60)
GFR calc non Af Amer: >60 mL/min (ref >60)

## 2019-01-26 MED ORDER — LACTATED RINGERS IV BOLUS
1000.0000 mL | Freq: Once | INTRAVENOUS | Status: AC
  Administered 2019-01-26: 18:00:00 1000 mL via INTRAVENOUS

## 2019-01-26 NOTE — Plan of Care (Signed)
Pt is progressing well. Has ambulated to bathroom and back a few times well. Whenever she gets up and goes back to bed the dressing gets new drainage. Dr. To check later this am. Pt denies pain and is drinking water well. Itching has been relieved with Nubain 5 mg [place] and benadryl PO. Will continue to monitor.

## 2019-01-26 NOTE — Progress Notes (Signed)
Dr. Rosana Hoes was called about this pt's dressing at [place] for 80% saturation through the honeycomb. A verbal order was given to change the honeycomb and add a pressure dressing. This was completed shortly after. The pt got up to the bathroom assisted at [place] and upon getting back to bed the pt's dressing was checked. There was small new drainage through the hypafix on the right lower corner. The corner was able to pull up easily and the honeycomb bottom right appeared saturated. There is some evidence of saturation at the top as well and a faint red appears through the hypafix. Dr. Rosana Hoes was made aware 0403 and the pt is stable and the drainage worsens upon getting up to and back from the bathroom (pt still has foley, just doing pad changes and ambulating). The decision was to wait until the am for another change and to call with any significant changes before then.

## 2019-01-26 NOTE — Progress Notes (Addendum)
Pt has not yet voided since catheter was taken out at 0930. Attempted to do a bladder scan, but it was unable to detect anything due to pressure dressing being in the way. Attempted to do an in and out catheter at 1540 and got very little urine return. Tried again around 1600 and got the same result. Pt's uterus was firm, but deviated to the right. Dr. Elonda Husky made aware of the low urine and was also made aware of the increased swelling in the lower extremeties. Received a verbal order to place an indwelling catheter. Pt refused at first, but then later on agreed to have the catheter placed. Will place catheter and continue to monitor. Dr. Elonda Husky en route to department to evaluate pt.

## 2019-01-26 NOTE — Progress Notes (Signed)
Faculty Attending Note  Post Op Day 1  Subjective: Patient is feeling well. She reports well controlled pain on PO pain meds. She is ambulating and reports small amount of light-headedness with walking. She has foley catheter in place. She is passing flatus, has not had a BM yet. She is tolerating a regular diet now, has some nausea last night. Bleeding is moderate. She is breast &bottle feeding. Babies are in NICU and doing well.  Objective: Blood pressure 121/74, pulse 66, temperature 98.5 F (36.9 C), temperature source Oral, resp. rate 18, height 5\' 3"  (1.6 m), weight 90.2 kg, SpO2 98 %, unknown if currently breastfeeding. Temp:  [97.5 F (36.4 C)-98.9 F (37.2 C)] 98.5 F (36.9 C) (08/23 0736) Pulse Rate:  [66-95] 66 (08/23 0736) Resp:  [13-28] 18 (08/23 0736) BP: (106-128)/(53-87) 121/74 (08/23 0736) SpO2:  [95 %-100 %] 98 % (08/23 0736) Weight:  [90.2 kg] 90.2 kg (08/22 1105)  Physical Exam:  General: alert, oriented, cooperative Chest: normal respiratory effort Heart: RRR  Abdomen:  soft, appropriately tender to palpation, incision covered by dressing which is saturated through honeycomb but not ABD  Uterine Fundus: firm, at the level of the umbilicus Lochia: moderate, rubra DVT Evaluation: no evidence of DVT Extremities: no edema, no calf tenderness  UOP: 35-70 mL/hr clear yellow urine  Recent Labs    01/25/19 1807 01/26/19 0530  HGB 9.0* 7.6*  HCT 29.7* 24.9*    Assessment/Plan: Patient Active Problem List   Diagnosis Date Noted  . Twin pregnancy, twins dichorionic and diamniotic 01/25/2019  . Preterm uterine contractions 01/25/2019  . Malpresentation of fetus, antepartum 01/25/2019  . Labor and delivery, indication for care 01/25/2019    Patient is 28 y.o. [id number] POD#1 s/p 1LTCS+BTL at [redacted]w[redacted]d for di/di twins with Twin A breech, in labor. Course complicated by some saturation of dressing overnight requiring one change. She is doing well, recovering  appropriately and complains only of some soreness.   Continue routine post partum care Pain meds prn Regular diet Lovenox 40 mg daily [place] S/p BTL for birth control   K. Arvilla Meres, M.D. Attending Center for [employer] (Faculty Practice)  01/26/2019, 8:42 AM

## 2019-01-26 NOTE — Progress Notes (Signed)
Pt got up to walk to the bathroom and passed a softball-sized clot. Returned pt back to bed and fundus was firm, midline, and U/1. Pt's honeycomb was also saturated with new, bright red blood. Dr. Elonda Husky made aware. Received verbal order to apply a new honeycomb and pressure dressing. Will continue to monitor.

## 2019-01-27 LAB — CULTURE, OB URINE
Culture: <10000 — AB
Special Requests: NORMAL

## 2019-01-27 NOTE — Progress Notes (Signed)
Subjective: Postpartum Day 2: Cesarean Delivery Patient reports feeling well. Patient is ambulating and tolerating a regular diet. Foley catheter was removed this morning.    Objective: Vital signs in last 24 hours: Temp:  [98.4 F (36.9 C)-98.9 F (37.2 C)] 98.5 F (36.9 C) (08/24 0825) Pulse Rate:  [65-77] 65 (08/24 0825) Resp:  [18] 18 (08/24 0825) BP: (110-131)/(62-79) 131/79 (08/24 0825) SpO2:  [100 %] 100 % (08/24 0825)  Physical Exam:  General: alert, cooperative and no distress Lochia: appropriate Uterine Fundus: firm Incision: honey comb dressing intact and clean DVT Evaluation: No evidence of DVT seen on physical exam.  Recent Labs    01/25/19 1807 01/26/19 0530  HGB 9.0* 7.6*  HCT 29.7* 24.9*    Assessment/Plan: Status post Cesarean section. Doing well postoperatively.  Voiding trial today Continue lovenox for DVT prophylaxis  Discharge planning tomorrow.  Jezreel Justiniano 01/27/2019, 9:49 AM

## 2019-01-27 NOTE — Progress Notes (Signed)
Patient screened out for psychosocial assessment since none of the following apply:  Psychosocial stressors documented in mother or baby's chart  Gestation less than 32 weeks  Code at delivery   Infant with anomalies Please contact the Clinical Social Worker if specific needs arise, by MOB's request, or if MOB scores greater than 9/yes to question 10 on Edinburgh Postpartum Depression Screen.  Jonda Alanis, LCSW Clinical Social Worker [facility] Cell#: [phone]     

## 2019-01-27 NOTE — Lactation Note (Signed)
This note was copied from a baby's chart. Lactation Consultation Note  Patient Name: Dawn Gould [id number] Date: 01/27/2019 Reason for consult: Initial assessment;Late-preterm 34-36.6wks;Infant < 6lbs;NICU baby;Multiple gestation  P3 mother whose infant twins are now 88 hours old.  These are LPTI twins weighing <6 lbs and in the NICU.  Mother was on the phone when I arrived.  She had a DEBP set up at bedside and informed me that she had been pumping every three hours but she "didn't have anything" yet.  I reassured her that this is expected but to continue pumping every three hours.  Encouraged her to do breast massage and hand expression before/after pumping to help increase milk supply.  Mother had no questions about pump or pump set up.  She stated she has been shown how to use it and how to do hand expression.  Colostrum containers at bedside.  Suggested mother obtain labels from NICU so she can date/time her colostrum when she is able to obtain drops.    Mother is a [facility] participant in [facility] and did not realize she could obtain a DEBP from the [facility] office.  Explained the procedure for obtaining the pump.  Asked mother to call the office this morning and inform them the babies have been born.  [facility] referral faxed.  Mother verbalized understanding and will do this today.    Mom made aware of O/P services, breastfeeding support groups, community resources, and our phone # for post-discharge questions.  "Providing Breast Milk For Your Baby in the NICU" booklet provided.  Milk storage times reviewed.  Support person present and sleeping.  Encouraged mother to call for any further questions/concerns.   Maternal Data Formula Feeding for Exclusion: Yes Reason for exclusion: Mother's choice to formula and breast feed on admission Has patient been taught Hand Expression?: Yes  Feeding Feeding Type: Formula  LATCH Score                   Interventions    Lactation Tools  Discussed/Used WIC Program: Yes   Consult Status Consult Status: Follow-up Date: 01/28/19 Follow-up type: In-patient    Tirsa Gail R Mackenzy Eisenberg 01/27/2019, 8:18 AM

## 2019-01-28 ENCOUNTER — Other Ambulatory Visit: Payer: Self-pay

## 2019-01-28 ENCOUNTER — Other Ambulatory Visit: Payer: Self-pay | Admitting: Lactation Services

## 2019-01-28 MED ORDER — PRENATAL MULTIVITAMIN CH: 1.0000 | ORAL_TABLET | Freq: Every day | ORAL | 6 refills | Status: DC

## 2019-01-28 MED ORDER — OXYCODONE HCL 5 MG PO TABS: 5.0000 mg | ORAL_TABLET | ORAL | 0 refills | Status: DC | PRN

## 2019-01-28 MED ORDER — IBUPROFEN 600 MG PO TABS: 600.0000 mg | ORAL_TABLET | Freq: Four times a day (QID) | ORAL | 0 refills | Status: DC | PRN

## 2019-01-28 NOTE — Lactation Note (Signed)
This note was copied from a baby's chart. Lactation Consultation Note  Patient Name: Dawn Gould [id number] Date: 01/28/2019 Reason for consult: Follow-up assessment;Late-preterm 34-36.6wks;NICU baby;Multiple gestation;Infant < 6lbs;Other (Comment)(mom for D/C today. [place] asked mom to call WIC - [place] for a DEBP - referral was sent 8/24)  Baby A is 45 hours old / NICU  LC visited mom in her room 102  As [place] entered the room mom sitting up getting ready to pump with the DEBP.  Per mom has not called [place] yet. LC recommended calling [place] - WIC prior to 0830 to talk to the rep since a DEBP referral was sent yesterday by the  [facility].  Per mom mentioned her breast were really full and hard . LC offered to assess and mom receptive. Breast are soft in some areas and very full but  Not engorged and not needing ice , instructed just to pump with the DEBP for 15 -20 mins .  LC reviewed supply / demand and the importance of being consistent with pumping both breast for 15 -20 mins around the clock. Mom denies sore nipples. Sore nipple and engorgement prevention and tx reviewed.  LC provided extra bottles for storage , reviewed quide lines and stressed the importance of pumping 8-10 times day around the clock. LC reminded mom to take all her pump pieces with her.  Mom aware she can have the nurses call when babies are able to breast feed for a feeding assessment. Mom receptive.    Maternal Data Has patient been taught Hand Expression?: Yes(per mom mentioned she is comfortable)  Feeding Feeding Type: Formula  LATCH Score                   Interventions Interventions: Breast feeding basics reviewed;DEBP  Lactation Tools Discussed/Used WIC Program: Yes Pump Review: Milk Storage   Consult Status Consult Status: PRN Date: (twins in NICU) Follow-up type: In-patient    [place] 01/28/2019, 8:22 AM

## 2019-01-28 NOTE — Progress Notes (Signed)
Discharge instructions reviewed with patient.  Discussed postpartum care including breast care, signs and symptoms of hypertension, baby blues, and incision care.  Reviewed follow up appointment times.  Patient verbalized understanding, appropriate questions asked.

## 2019-01-28 NOTE — Discharge Instructions (Signed)

## 2019-01-28 NOTE — Progress Notes (Signed)
Received call from [facility] on [address] in [place] needing change in dosage to Oxycodone due to being over the Morphine Milli Equivilent dosage for [place].   Called and spoke with Dr. Elly Modena who agreed to changing dosage to 1 tablet (5mg ) every 4 hours. Orders changed in patients chart.   Aaron Edelman at [facility] on [address] in [place] and informed him of order changes. He reports it would be changed and will get order filled.

## 2019-01-29 LAB — TYPE AND SCREEN
ABO/RH(D): O POS
Antibody Screen: NEGATIVE
Unit division: 0
Unit division: 0

## 2019-01-29 LAB — BPAM RBC
Blood Product Expiration Date: 202009182359
Blood Product Expiration Date: 202009182359
Unit Type and Rh: 5100
Unit Type and Rh: 5100

## 2019-02-03 ENCOUNTER — Ambulatory Visit: Payer: Self-pay

## 2019-02-03 NOTE — Lactation Note (Signed)
This note was copied from a baby's chart. Lactation Consultation Note  Patient Name: Dawn Gould [id number] Date: 02/03/2019 Reason for consult: Follow-up assessment;Late-preterm 34-36.6wks;NICU baby;Infant < 6lbs   Mom requested lactation assistance with twins in NICU.  Mom was holding baby A in blankets.  LC undressed infant and placed him STS with mom.  Moms breast were leaking.  She pumped before she came and states she is pumping every 2-3 hours.  She allowed infant to nuzzle at breast and he began rooting.  A NS size 20 and NS 24 were both tried in trying to help infant achieve latch.  Infant would open, then close on the NS without latching.  LC encouraged mom to just hold infant STS and let him snuggle.    Baby B was then placed on alternate side/breast and he began rooting. He was unable to latch, but was trying.  NS 24 placed, and he latched deeply, took a couple of sucks, then ceased sucking and just held nipple in mouth.  Mom held both babies STS and LC praised her for her efforts pumping and holding babies to breastfeed them.    Baby B was then bottle fed by RN while mom held baby A next to breast.  Infant A began rooting and licked nipple then opened to latch.  Mom brought infant close when gape was wide and infant latched without sheild.  A few sucks were seen, then a long pause, then a few more sucks.  Mom was very excited.  LC discussed with mom pumping; mom denies any issues.  LC assured mom infants did great at first attempt to feed and encouraged her to keep trying.  Discussed with mom when/why/ how to apply nipple shield.  Encouraged mom to let RN know when she would be coming in to attempt bf and desired for lactation to meet with her in NICU for assistance with breastfeeding the twins.    A manual hand pump was given in order to collect while here, mom was leaking and did not have pump parts here.  Mom used it as soon as LC provided it for her.    Mom expressed her desire  to breastfeed the twins and was excited to receive encouragement for future attempts.       Maternal Data Has patient been taught Hand Expression?: Yes Does the patient have breastfeeding experience prior to this delivery?: No  Feeding Feeding Type: Breast Milk  LATCH Score Latch: Repeated attempts needed to sustain latch, nipple held in mouth throughout feeding, stimulation needed to elicit sucking reflex.  Audible Swallowing: None  Type of Nipple: Everted at rest and after stimulation  Comfort (Breast/Nipple): Filling, red/small blisters or bruises, mild/mod discomfort  Hold (Positioning): Assistance needed to correctly position infant at breast and maintain latch.  LATCH Score: 5  Interventions Interventions: Breast feeding basics reviewed;Skin to skin;Position options;Support pillows;Adjust position  Lactation Tools Discussed/Used     Consult Status Consult Status: Follow-up Date: 02/04/19 Follow-up type: In-patient    Ferne Coe [facility] 02/03/2019, 9:53 PM

## 2019-02-03 NOTE — Lactation Note (Signed)
This note was copied from a baby's chart. Lactation Consultation Note  Patient Name: Dawn Gould [id number] Date: 02/03/2019 Reason for consult: Follow-up assessment    Mom requested lactation assistance with twins in NICU.  Mom was holding baby A in blankets.  LC undressed infant and placed him STS with mom.  Moms breast were leaking.  She pumped before she came and states she is pumping every 2-3 hours.  She allowed infant to nuzzle at breast and he began rooting.  A NS size 20 and NS 24 were both tried in trying to help infant achieve latch.  Infant would open, then close on the NS without latching.  LC encouraged mom to just hold infant STS and let him snuggle.    Baby B was then placed on alternate side/breast and he began rooting. He was unable to latch, but was trying.  NS 24 placed, and he latched deeply, took a couple of sucks, then ceased sucking and just held nipple in mouth.  Mom held both babies STS and LC praised her for her efforts pumping and holding babies to breastfeed them.    Baby B was then bottle fed by RN while mom held baby A next to breast.  Infant A began rooting and licked nipple then opened to latch.  Mom brought infant close when gape was wide and infant latched without sheild.  A few sucks were seen, then a long pause, then a few more sucks.  Mom was very excited.  LC discussed with mom pumping; mom denies any issues.  LC assured mom infants did great at first attempt to feed and encouraged her to keep trying.  Discussed with mom when/why/ how to apply nipple shield.  Encouraged mom to let RN know when she would be coming in to attempt bf and desired for lactation to meet with her in NICU for assistance with breastfeeding the twins.    A manual hand pump was given in order to collect while here, mom was leaking and did not have pump parts here.  Mom used it as soon as LC provided it for her.    Mom expressed her desire to breastfeed the twins and was excited to  receive encouragement for future attempts.     Maternal Data Has patient been taught Hand Expression?: Yes Does the patient have breastfeeding experience prior to this delivery?: No  Feeding Feeding Type: Breast Fed Nipple Type: Nfant Extra Slow Flow (gold)  LATCH Score Latch: Too sleepy or reluctant, no latch achieved, no sucking elicited.  Audible Swallowing: None  Type of Nipple: Everted at rest and after stimulation  Comfort (Breast/Nipple): Filling, red/small blisters or bruises, mild/mod discomfort  Hold (Positioning): Assistance needed to correctly position infant at breast and maintain latch.  LATCH Score: 4  Interventions Interventions: Breast feeding basics reviewed;Skin to skin;Support pillows;Position options;Adjust position  Lactation Tools Discussed/Used     Consult Status Consult Status: Follow-up Date: 02/04/19 Follow-up type: In-patient    Ferne Coe [facility] 02/03/2019, 10:21 PM

## 2019-02-03 NOTE — Lactation Note (Signed)
This note was copied from a baby's chart. Lactation Consultation Note  Patient Name: Dawn Gould Today's Date: 02/03/2019    RN requested lactation to meet for 6 pm feed, for breastfeeding assistance.    LC entered room and mom was not there.  RN was bottle feeding baby B.  Mom did not call to say she wasn't coming.    RN will ask mom if she desires lactation to call her.   RN will reach out to [place] with mom's number if she does want a call from lactation tonight.  LC also let RN know lactation help would be available until 11:30 pm this evening if mom did come to the hospital for the 9pm feeding.           Maternal Data    Feeding Feeding Type: Breast Milk  LATCH Score                   Interventions    Lactation Tools Discussed/Used     Consult Status      Ferne Coe [facility] 02/03/2019, 6:27 PM

## 2019-02-05 ENCOUNTER — Ambulatory Visit (INDEPENDENT_AMBULATORY_CARE_PROVIDER_SITE_OTHER): Payer: Medicaid Other | Admitting: General Practice

## 2019-02-05 ENCOUNTER — Inpatient Hospital Stay (HOSPITAL_COMMUNITY)
Admission: AD | Admit: 2019-02-05 | Discharge: 2019-02-05 | Disposition: A | Discharge status: Home / Self Care | Payer: Medicaid Other | Attending: Family Medicine | Admitting: Family Medicine

## 2019-02-05 ENCOUNTER — Other Ambulatory Visit: Payer: Self-pay

## 2019-02-05 ENCOUNTER — Encounter (HOSPITAL_COMMUNITY): Payer: Self-pay | Admitting: Obstetrics and Gynecology

## 2019-02-05 VITALS — BP 186/110 | HR 68 | Ht 63.0 in | Wt 167.0 lb

## 2019-02-05 DIAGNOSIS — O99335 Smoking (tobacco) complicating the puerperium: Secondary | ICD-10-CM | POA: Diagnosis not present

## 2019-02-05 DIAGNOSIS — O1205 Gestational edema, complicating the puerperium: Secondary | ICD-10-CM | POA: Diagnosis not present

## 2019-02-05 DIAGNOSIS — F17210 Nicotine dependence, cigarettes, uncomplicated: Secondary | ICD-10-CM | POA: Insufficient documentation

## 2019-02-05 DIAGNOSIS — O165 Unspecified maternal hypertension, complicating the puerperium: Secondary | ICD-10-CM

## 2019-02-05 DIAGNOSIS — Z5189 Encounter for other specified aftercare: Secondary | ICD-10-CM

## 2019-02-05 LAB — URINALYSIS, ROUTINE W REFLEX MICROSCOPIC
Bilirubin Urine: NEGATIVE
Glucose, UA: NEGATIVE mg/dL
Ketones, ur: NEGATIVE mg/dL
Nitrite: NEGATIVE
Protein, ur: 30 mg/dL — AB
Specific Gravity, Urine: 1.024 (ref 1.005–1.030)
pH: 6 (ref 5.0–8.0)

## 2019-02-05 LAB — CBC
HCT: 27.7 % — ABNORMAL LOW (ref 36.0–46.0)
Hemoglobin: 8.5 g/dL — ABNORMAL LOW (ref 12.0–15.0)
MCH: 24.8 pg — ABNORMAL LOW (ref 26.0–34.0)
MCHC: 30.7 g/dL (ref 30.0–36.0)
MCV: 80.8 fL (ref 80.0–100.0)
Platelets: 350 10*3/uL (ref 150–400)
RBC: 3.43 MIL/uL — ABNORMAL LOW (ref 3.87–5.11)
RDW: 17.5 % — ABNORMAL HIGH (ref 11.5–15.5)
WBC: 10.3 10*3/uL (ref 4.0–10.5)
nRBC: 0 % (ref 0.0–0.2)

## 2019-02-05 LAB — COMPREHENSIVE METABOLIC PANEL
ALT: 29 U/L (ref 0–44)
AST: 16 U/L (ref 15–41)
Albumin: 2.6 g/dL — ABNORMAL LOW (ref 3.5–5.0)
Alkaline Phosphatase: 88 U/L (ref 38–126)
Anion gap: 11 (ref 5–15)
BUN: 13 mg/dL (ref 6–20)
CO2: 21 mmol/L — ABNORMAL LOW (ref 22–32)
Calcium: 8.1 mg/dL — ABNORMAL LOW (ref 8.9–10.3)
Chloride: 107 mmol/L (ref 98–111)
Creatinine, Ser: 0.68 mg/dL (ref 0.44–1.00)
GFR calc Af Amer: >60 mL/min (ref >60)
GFR calc non Af Amer: >60 mL/min (ref >60)
Glucose, Bld: 73 mg/dL (ref 70–99)
Potassium: 3.2 mmol/L — ABNORMAL LOW (ref 3.5–5.1)
Sodium: 139 mmol/L (ref 135–145)
Total Bilirubin: 0.6 mg/dL (ref 0.3–1.2)
Total Protein: 6 g/dL — ABNORMAL LOW (ref 6.5–8.1)

## 2019-02-05 LAB — PROTEIN / CREATININE RATIO, URINE
Creatinine, Urine: 274.1 mg/dL
Protein Creatinine Ratio: 0.11 mg/mg{Cre} (ref 0.00–0.15)
Total Protein, Urine: 29 mg/dL

## 2019-02-05 MED ORDER — LABETALOL HCL 5 MG/ML IV SOLN
20.0000 mg | INTRAVENOUS | Status: DC | PRN
  Administered 2019-02-05: 16:00:00 20 mg via INTRAVENOUS
  Filled 2019-02-05: qty 4

## 2019-02-05 MED ORDER — HYDRALAZINE HCL 20 MG/ML IJ SOLN
5.0000 mg | INTRAMUSCULAR | Status: DC | PRN
  Administered 2019-02-05: 5 mg via INTRAVENOUS
  Filled 2019-02-05: qty 1

## 2019-02-05 MED ORDER — HYDRALAZINE HCL 20 MG/ML IJ SOLN
10.0000 mg | INTRAMUSCULAR | Status: DC | PRN
  Administered 2019-02-05: 10 mg via INTRAVENOUS
  Filled 2019-02-05: qty 1

## 2019-02-05 MED ORDER — FUROSEMIDE 10 MG/ML IJ SOLN
20.0000 mg | Freq: Once | INTRAMUSCULAR | Status: AC
  Administered 2019-02-05: 20 mg via INTRAVENOUS
  Filled 2019-02-05 (×2): qty 2

## 2019-02-05 MED ORDER — NIFEDIPINE ER OSMOTIC RELEASE 30 MG PO TB24: 30.0000 mg | ORAL_TABLET | Freq: Every day | ORAL | 0 refills | Status: DC

## 2019-02-05 MED ORDER — HYDROCHLOROTHIAZIDE 25 MG PO TABS: 25.0000 mg | ORAL_TABLET | Freq: Every day | ORAL | 0 refills | Status: DC

## 2019-02-05 MED ORDER — LABETALOL HCL 5 MG/ML IV SOLN
40.0000 mg | INTRAVENOUS | Status: DC | PRN
  Administered 2019-02-05: 40 mg via INTRAVENOUS
  Filled 2019-02-05: qty 8

## 2019-02-05 NOTE — MAU Provider Note (Signed)
History     CSN: [id number]  Arrival date and time: 02/05/19 1514   First Provider Initiated Contact with Patient 02/05/19 1603      Chief Complaint  Patient presents with  . Hypertension   HPI  [name] is a 28 y.o. year old [id number] female at 73 days postpartum s/p C/S of twins at 37 wks who was sent to MAU from [place] for severe range BPs. She was seen in the office for an incision check; where her BPs were 169/108 and 186/110. She has "bad swelling in both her legs and feet". She did not have gHTN or PEC in her pregnancy. She denies any H/A, blurry vision, N/V, or RUQ pain. She reports her twins are still in the NICU, but doing well.  Past Medical History:  Diagnosis Date  . Asthma   . Back pain     Past Surgical History:  Procedure Laterality Date  . CESAREAN SECTION N/A 01/25/2019   Procedure: CESAREAN SECTION;  Surgeon: Sloan Leiter, MD;  Location: [facility] LD ORS;  Service: Obstetrics;  Laterality: N/A;  . NO PAST SURGERIES      Family History  Problem Relation Age of Onset  . Hypertension Mother   . Diabetes Paternal Uncle     Social History   Tobacco Use  . Smoking status: Current Every Day Smoker    Types: Cigarettes  . Smokeless tobacco: Never Used  Substance Use Topics  . Alcohol use: No  . Drug use: No    Allergies:  Allergies  Allergen Reactions  . Tramadol     Hot flash    No medications prior to admission.    Review of Systems  Constitutional: Negative.   HENT: Negative.   Eyes: Negative.  Negative for visual disturbance.  Cardiovascular: Positive for leg swelling (both legs and feet).  Gastrointestinal: Negative.   Endocrine: Negative.   Genitourinary: Positive for vaginal bleeding (small amount).  Musculoskeletal: Negative.   Skin: Negative.   Allergic/Immunologic: Negative.   Neurological: Negative.  Negative for headaches.  Hematological: Negative.   Psychiatric/Behavioral: Negative.    Physical Exam   Patient Vitals  for the past 24 hrs:  BP Temp Temp src Pulse Resp SpO2 Height Weight  02/05/19 2008 (!) 156/88 - - 88 - - - -  02/05/19 2007 (!) 153/92 - - 88 - - - -  02/05/19 1816 (!) 150/86 - - 82 - - - -  02/05/19 1801 (!) 145/84 - - 86 - - - -  02/05/19 1746 (!) 151/84 - - 81 - - - -  02/05/19 1731 (!) 146/84 - - 84 - - - -  02/05/19 1716 (!) 155/87 - - 85 - - - -  02/05/19 1715 (!) 155/87 - - 85 - - - -  02/05/19 1708 (!) 166/98 - - - - - - -  02/05/19 1701 (!) 171/92 - - 71 - - - -  02/05/19 1645 (!) 158/88 - - 70 - - - -  02/05/19 1631 (!) 169/91 - - 68 - - - -  02/05/19 1616 (!) 162/89 - - 66 - - - -  02/05/19 1601 (!) 165/97 - - 71 - - - -  02/05/19 1550 - - - - - 98 % - -  02/05/19 1546 (!) 174/101 - - 71 - - - -  02/05/19 1545 - - - - - 99 % - -  02/05/19 1542 (!) 165/105 - - 81 - - - -  02/05/19 1527 (!) 184/105 98.6 F (37 C) Oral 66 18 100 % 5\' 3"  (1.6 m) 76.1 kg     Physical Exam  Nursing note and vitals reviewed. Constitutional: She is oriented to person, place, and time. She appears well-developed and well-nourished.  HENT:  Head: Normocephalic and atraumatic.  Eyes: Pupils are equal, round, and reactive to light.  Neck: Normal range of motion.  Cardiovascular: Normal rate and regular rhythm.  Respiratory: Effort normal and breath sounds normal.  GI: Soft.  Genitourinary:    Genitourinary Comments: deferred   Musculoskeletal: Normal range of motion.        General: Edema (2+ pitting edema bilaterally) present.  Neurological: She is alert and oriented to person, place, and time. She has normal reflexes.  Skin: Skin is warm and dry.  Psychiatric: She has a normal mood and affect. Her behavior is normal. Judgment and thought content normal.    MAU Course  Procedures  MDM CCUA CBC CMP P/C Ratio Serial BP's    *Consult with Dr. Despina Hidden @ 1910 - notified of patient's complaints, assessments, lab & results, tx plan d/c home with F/U BP check on Friday 02/07/2019 --  recommended tx plan: Rx HCTZ 25 mg daily x 10 days and Procardia XL 30 mg daily and BP check on Friday 02/07/2019 -  ok to d/c home  Results for orders placed or performed during the hospital encounter of 02/05/19 (from the past 24 hour(s))  CBC     Status: Abnormal   Collection Time: 02/05/19  3:34 PM  Result Value Ref Range   WBC 10.3 4.0 - 10.5 K/uL   RBC 3.43 (L) 3.87 - 5.11 MIL/uL   Hemoglobin 8.5 (L) 12.0 - 15.0 g/dL   HCT 94.7 (L) 65.4 - 65.0 %   MCV 80.8 80.0 - 100.0 fL   MCH 24.8 (L) 26.0 - 34.0 pg   MCHC 30.7 30.0 - 36.0 g/dL   RDW 35.4 (H) 65.6 - 81.2 %   Platelets 350 150 - 400 K/uL   nRBC 0.0 0.0 - 0.2 %  Comprehensive metabolic panel     Status: Abnormal   Collection Time: 02/05/19  3:34 PM  Result Value Ref Range   Sodium 139 135 - 145 mmol/L   Potassium 3.2 (L) 3.5 - 5.1 mmol/L   Chloride 107 98 - 111 mmol/L   CO2 21 (L) 22 - 32 mmol/L   Glucose, Bld 73 70 - 99 mg/dL   BUN 13 6 - 20 mg/dL   Creatinine, Ser 7.51 0.44 - 1.00 mg/dL   Calcium 8.1 (L) 8.9 - 10.3 mg/dL   Total Protein 6.0 (L) 6.5 - 8.1 g/dL   Albumin 2.6 (L) 3.5 - 5.0 g/dL   AST 16 15 - 41 U/L   ALT 29 0 - 44 U/L   Alkaline Phosphatase 88 38 - 126 U/L   Total Bilirubin 0.6 0.3 - 1.2 mg/dL   GFR calc non Af Amer >60 >60 mL/min   GFR calc Af Amer >60 >60 mL/min   Anion gap 11 5 - 15  Urinalysis, Routine w reflex microscopic     Status: Abnormal   Collection Time: 02/05/19  4:00 PM  Result Value Ref Range   Color, Urine YELLOW YELLOW   APPearance HAZY (A) CLEAR   Specific Gravity, Urine 1.024 1.005 - 1.030   pH 6.0 5.0 - 8.0   Glucose, UA NEGATIVE NEGATIVE mg/dL   Hgb urine dipstick LARGE (A) NEGATIVE   Bilirubin Urine  NEGATIVE NEGATIVE   Ketones, ur NEGATIVE NEGATIVE mg/dL   Protein, ur 30 (A) NEGATIVE mg/dL   Nitrite NEGATIVE NEGATIVE   Leukocytes,Ua MODERATE (A) NEGATIVE   RBC / HPF 0-5 0 - 5 RBC/hpf   WBC, UA 11-20 0 - 5 WBC/hpf   Bacteria, UA RARE (A) NONE SEEN   Squamous Epithelial /  LPF 6-10 0 - 5   Mucus PRESENT   Protein / creatinine ratio, urine     Status: None   Collection Time: 02/05/19  4:00 PM  Result Value Ref Range   Creatinine, Urine 274.10 mg/dL   Total Protein, Urine 29 mg/dL   Protein Creatinine Ratio 0.11 0.00 - 0.15 mg/mg[Cre]      Assessment and Plan  Postpartum hypertension - Rx for Procardia XL 30 mg daily - Advised that labs are normal - No diagnosis of PEC at this time - Information provided on PEC - F/U BP check at [facility] on Friday 02/07/2019   Postpartum edema  - Advised that the dose of Lasix she received tonight will cause her to have increased urination - Rx for HCTZ 25 mg daily x 10 days  - Discharge patient - Go to [facility] on Friday 02/07/2019   msg sent to Admin Pool to get BP check scheduled - Advised that someone will call her to give her a time for her BP check appt - Patient verbalized an understanding of the plan of care and agrees.     Dawn Moraolitta Dionicio Shelnutt, MSN, CNM 02/05/2019, 4:04 PM

## 2019-02-05 NOTE — MAU Note (Signed)
Pt delivered C-sect on 8/22. She is G5P3. She  was at office for wound check and BP was found to be 160/110 and 180/110. Denies HA, blurry vision, URQ pain.

## 2019-02-05 NOTE — Discharge Instructions (Signed)
Please make sure you are drinking at least 10-12 bottles of water daily. Please return to MAU for a headache that is not relieved with Tylenol or Ibuprofen after 2 hours of taking it. The medication we gave you here (Lasix) plus the IV fluids will cause you to urinate a lot this evening. The medication (hydrochlorothiazide) prescribed to you will also cause you to urinate a lot. It is important for you to take both your medications as prescribed every day. I will send a message to the office for someone to call you with a time to come for a Blood pressure check on Friday 02/07/2019.

## 2019-02-05 NOTE — Progress Notes (Signed)
Patient presents to office today for wound check due to concern about potential infection. Patient had primary c-section on 8/22. Patient is noted to have +2 edema in her feet & +4 edema in her legs. She does not report problems with her BP in pregnancy. Wound is clean, dry & intact- appears to be well healed. BP 169/108 and 186/110- discussed with Dr Roselie Awkward who states patient needs to go to MAU for evaluation. Informed patient. MAU called & notified.  Koren Bound RN BSN 02/05/19

## 2019-02-06 ENCOUNTER — Telehealth: Payer: Self-pay | Admitting: Family Medicine

## 2019-02-06 NOTE — Telephone Encounter (Signed)
Attempted to call patient about her appointment on 9/4 @ 9:00. No answer left voicemail instructing patient to wear a face mask for the entire appointment and no visitors are allowed during the visit. Patient instructed not to attend the appointment if she was any symptoms. Symptom list and office number left.

## 2019-02-07 ENCOUNTER — Ambulatory Visit: Payer: Medicaid Other

## 2019-02-13 ENCOUNTER — Ambulatory Visit: Payer: Medicaid Other

## 2019-02-27 ENCOUNTER — Telehealth: Payer: Self-pay | Admitting: Obstetrics and Gynecology

## 2019-02-27 NOTE — Telephone Encounter (Signed)
Spoke to patient about her appointment on 9/25 @ 10:35. Patient instructed to wear a face mask for the entire appointment and no visitors are allowed with her during the visit. Patient screened for covid symptoms and denied having any.

## 2019-02-28 ENCOUNTER — Encounter: Payer: Self-pay | Admitting: Family Medicine

## 2019-02-28 ENCOUNTER — Ambulatory Visit: Payer: Medicaid Other | Admitting: Obstetrics and Gynecology

## 2021-04-21 IMAGING — US US MFM OB FOLLOW UP ADDL GEST
1 series · 13 of 28 positions shown · non-contrast
Comparison: none

[Series 1: us mfm ob follow up addl gest · 82 acquisitions, 13 frames shown]
[im 4/82]
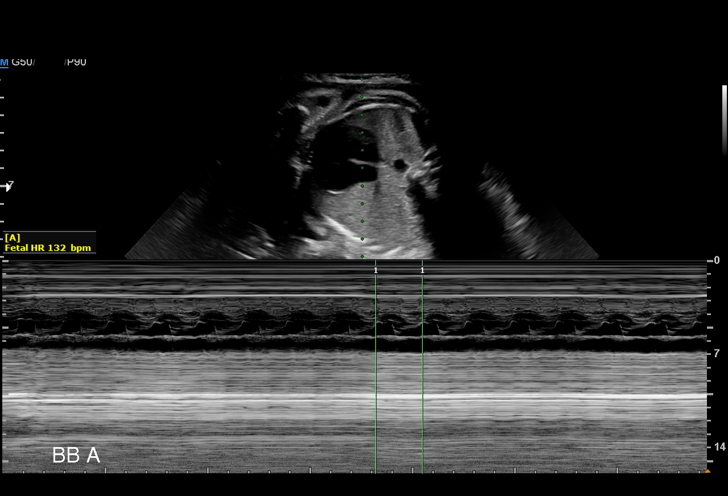
[im 10/82]
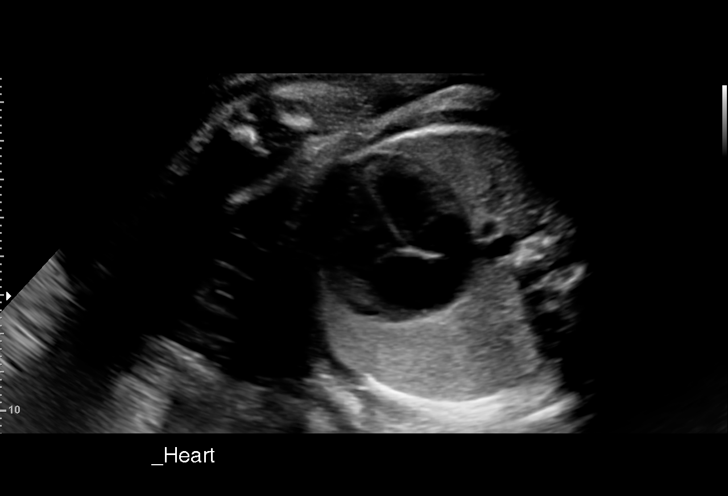
[im 16/82]
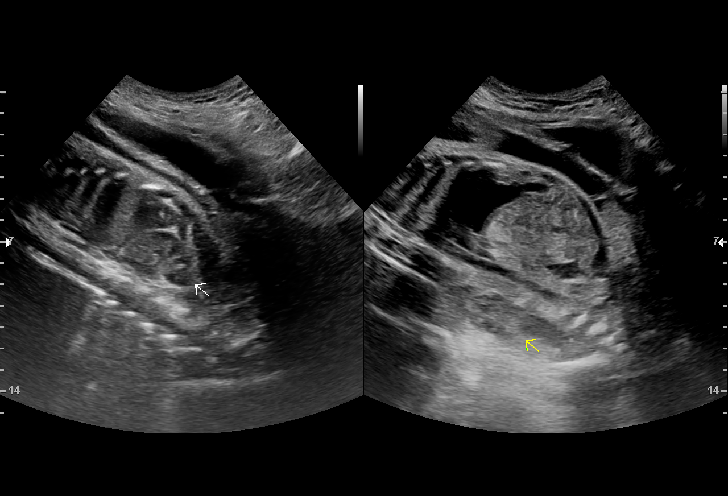
[im 22/82]
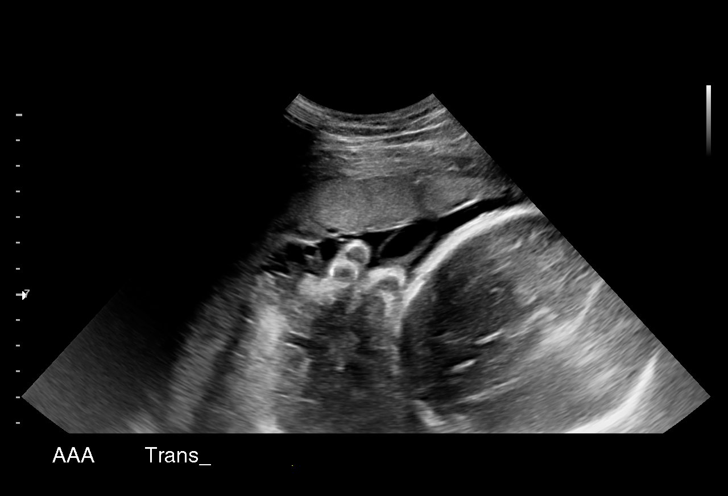
[im 28/82]
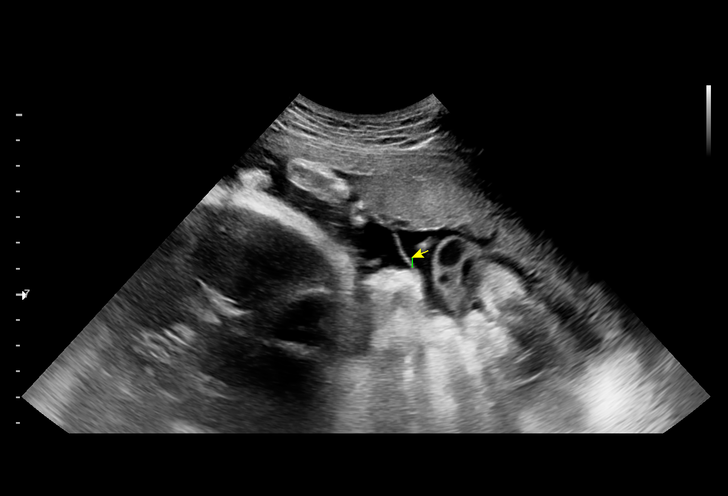
[im 34/82]
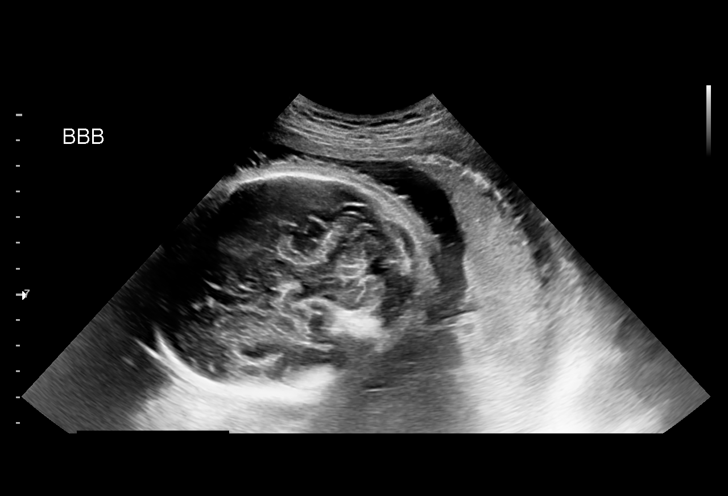
[im 43/82]
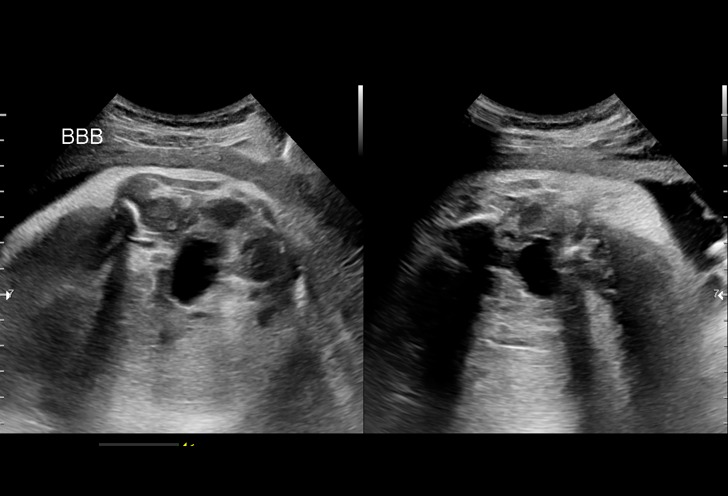
[im 49/82]
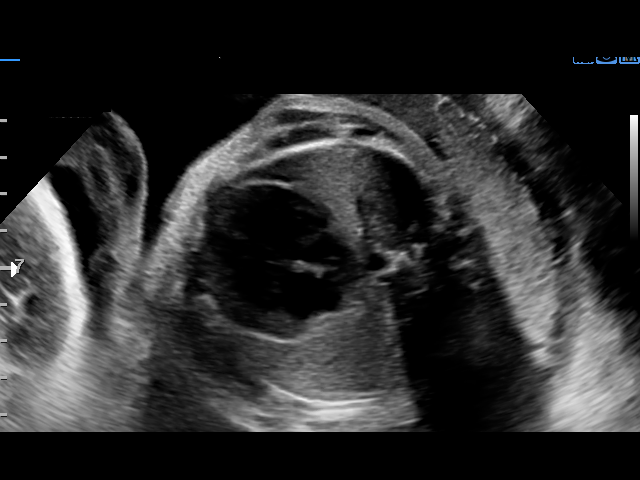
[im 55/82]
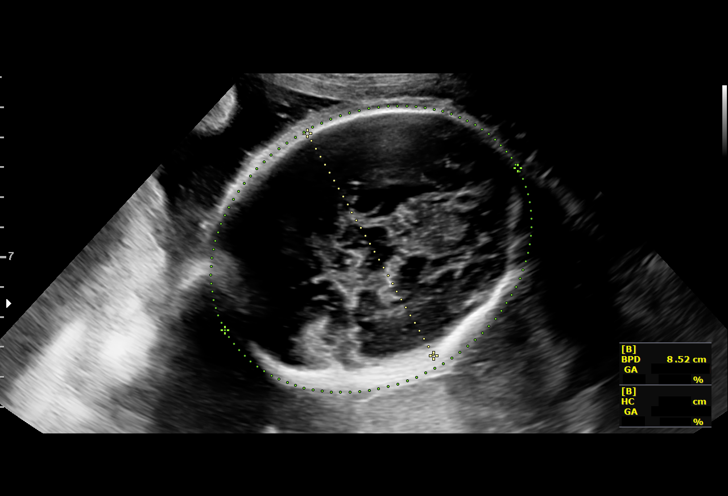
[im 61/82]
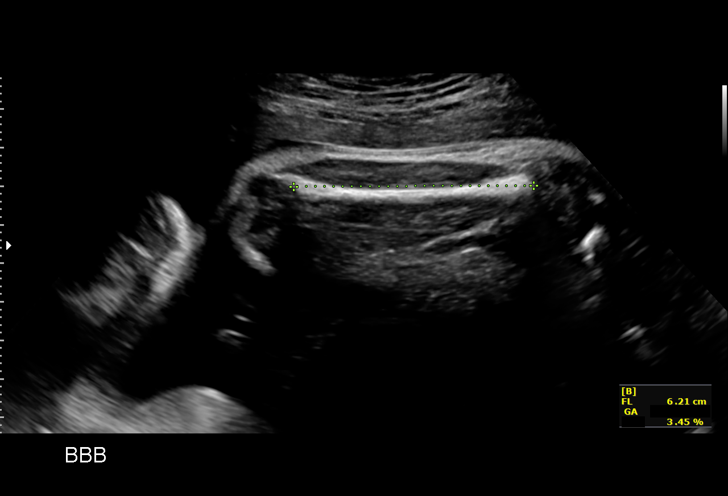
[im 67/82]
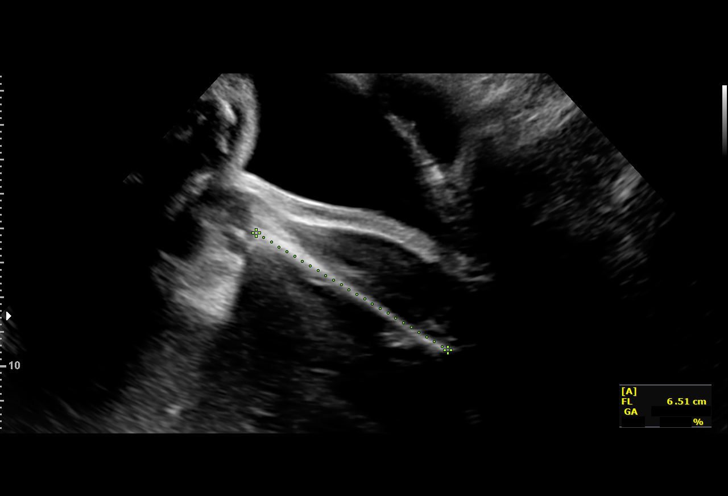
[im 73/82]
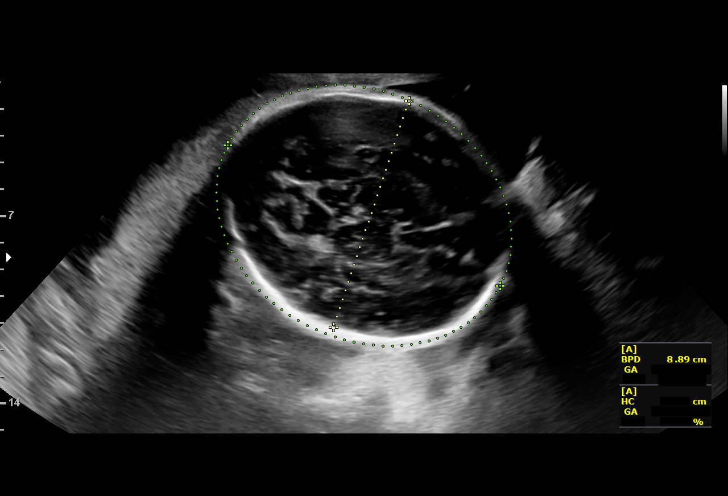
[im 79/82]
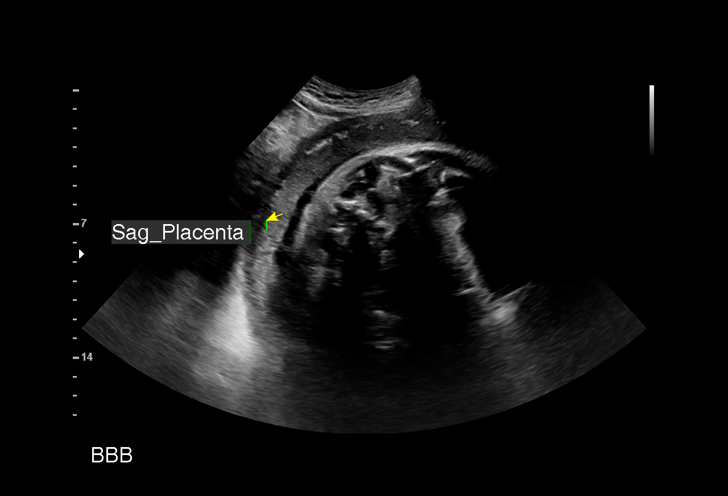

[13 of 28 positions shown; findings below may reference images not displayed]

Attending:        Ma Shpk Iballi      Secondary Phy.:    [id number]-
                                                             MAU/Triage
 Referred By:      RICOBOM                [facility]                                [[facility]]

     [facility]
 ----------------------------------------------------------------------

 ----------------------------------------------------------------------
Indications

  Twin pregnancy, di/di, third trimester
  Preterm contractions
  Leakage of amniotic fluid
  34 weeks gestation of pregnancy

 ----------------------------------------------------------------------
Vital Signs

 BMI:
Fetal Evaluation (Fetus A)

 Num Of Fetuses:          2
 Fetal Heart Rate(bpm):   132
 Cardiac Activity:        Observed
 Fetal Lie:               Maternal left side, lower fetus
 Presentation:            Breech
 Placenta:                Anterior
 Membrane Desc:      Dividing Membrane seen

 Amniotic Fluid
 AFI FV:      Within normal limits

                             Largest Pocket(cm)


 Comment:    Breech presentation. No placental abruption or previa identified.
Biometry (Fetus A)

 BPD:      86.7  mm     G. Age:  35w 0d         66  %    CI:          74.8  %    70 - 86
                                                         FL/HC:       20.2  %    19.4 -
 HC:      318.1  mm     G. Age:  35w 6d         49  %    HC/AC:       0.99       0.96 -
 AC:      321.8  mm     G. Age:  36w 1d         92  %    FL/BPD:      74.3  %    71 - 87
 FL:       64.4  mm     G. Age:  33w 2d         14  %    FL/AC:       20.0  %    20 - 24

 Est. FW:    9097   gm   5 lb 12 oz      68  %     FW Discordancy      0 \ 4 %
OB History

 Gravidity:    5         Term:   1         SAB:   3
 Living:       1
Gestational Age (Fetus A)

 LMP:           34w 3d        Date:  05/29/18                 EDD:   03/05/19
 U/S Today:     35w 1d                                        EDD:   02/28/19
 Best:          34w 3d     Det. By:  LMP  (05/29/18)          EDD:   03/05/19
Anatomy (Fetus A)

 Ventricles:            Appears normal         Kidneys:                Appear normal
 Heart:                 Appears normal         Bladder:                Appears normal
                        (4CH, axis, and
                        situs)
 Stomach:               Appears normal, left
                        sided

Fetal Evaluation (Fetus B)

 Num Of Fetuses:          2
 Fetal Heart Rate(bpm):   142
 Cardiac Activity:        Observed
 Fetal Lie:               Upper Fetus
 Presentation:            Transverse, head to maternal right
 Placenta:                RT Fundal
 Membrane Desc:      Dividing Membrane seen

 Amniotic Fluid
 AFI FV:      Within normal limits

                             Largest Pocket(cm)


 Comment:    No placental abruption or previa identified.
Biometry (Fetus B)

 BPD:      85.5  mm     G. Age:  34w 3d         50  %    CI:        72.57   %    70 - 86
                                                         FL/HC:       19.6  %    19.4 -
 HC:      319.2  mm     G. Age:  36w 0d         54  %    HC/AC:       1.00       0.96 -
 AC:      318.7  mm     G. Age:  35w 6d         88  %    FL/BPD:      73.2  %    71 - 87
 FL:       62.6  mm     G. Age:  32w 3d          5  %    FL/AC:       19.6  %    20 - 24

 Est. FW:    4131   gm     5 lb 9 oz     56  %     FW Discordancy         4  %
Gestational Age (Fetus B)
 LMP:           34w 3d        Date:  05/29/18                 EDD:   03/05/19
 U/S Today:     34w 5d                                        EDD:   03/03/19
 Best:          34w 3d     Det. By:  LMP  (05/29/18)          EDD:   03/05/19
Anatomy (Fetus B)

 Cavum:                 Appears normal         Heart:                  Appears normal
                                                                       (4CH, axis, and
                                                                       situs)
 Ventricles:            Appears normal         Stomach:                Appears normal, left
                                                                       sided
 Cerebellum:            Appears normal         Kidneys:                Appear normal
 Posterior Fossa:       Appears normal         Bladder:                Appears normal
Cervix Uterus Adnexa

 Cervix
 Not adaquately visualized
Impression

 Diamniotic Dichorionic Twin pregnancy
 Too early for anatomy
Recommendations

 Follow up anatomy between 18-20 weeks with cervical length
 Consider first trimester screen between 11-13 weeks.

## 2021-08-02 ENCOUNTER — Other Ambulatory Visit: Payer: Self-pay

## 2021-08-02 ENCOUNTER — Encounter (HOSPITAL_BASED_OUTPATIENT_CLINIC_OR_DEPARTMENT_OTHER): Payer: Self-pay

## 2021-08-02 ENCOUNTER — Emergency Department (HOSPITAL_BASED_OUTPATIENT_CLINIC_OR_DEPARTMENT_OTHER)
Admission: EM | Admit: 2021-08-02 | Discharge: 2021-08-02 | Disposition: A | Discharge status: Home / Self Care | Payer: Medicaid Other | Attending: Emergency Medicine | Admitting: Emergency Medicine

## 2021-08-02 DIAGNOSIS — L02412 Cutaneous abscess of left axilla: Secondary | ICD-10-CM

## 2021-08-02 LAB — PREGNANCY, URINE: Preg Test, Ur: NEGATIVE

## 2021-08-02 MED ORDER — LIDOCAINE HCL (PF) 1 % IJ SOLN
10.0000 mL | Freq: Once | INTRAMUSCULAR | Status: AC
  Administered 2021-08-02: 10 mL via INTRADERMAL
  Filled 2021-08-02 (×2): qty 10

## 2021-08-02 MED ORDER — DOXYCYCLINE HYCLATE 100 MG PO CAPS: 100.0000 mg | ORAL_CAPSULE | Freq: Two times a day (BID) | ORAL | 0 refills | Status: DC

## 2021-08-02 MED ORDER — OXYCODONE-ACETAMINOPHEN 5-325 MG PO TABS: 1.0000 | ORAL_TABLET | Freq: Four times a day (QID) | ORAL | 0 refills | Status: DC | PRN

## 2021-08-02 MED ORDER — KETOROLAC TROMETHAMINE 60 MG/2ML IM SOLN
30.0000 mg | Freq: Once | INTRAMUSCULAR | Status: AC
  Administered 2021-08-02: 30 mg via INTRAMUSCULAR
  Filled 2021-08-02: qty 2

## 2021-08-02 NOTE — Discharge Instructions (Addendum)
You were seen here today for evaluation of your left axilla.  We were able to drain one of the areas.  Please come back in 48 hours for wound recheck and for packing removal.  If the packing falls out on its own, that is okay, but do not pull it out.  Please keep the area clean with Dial soap and water daily and change the bandage daily.  The other 2 areas will likely need to be drained, but there is no thing to drain there today.  Please continue applying the warm compresses multiple times daily to the area to help with the pain and help express the fluid from the area.  You can take Tylenol and ibuprofen together, 1000 mg of Tylenol, and 600 mg of ibuprofen every 6 hours as needed for pain.  Since you were given Toradol here, I would continue doing this around 5 PM today.  You can take the Percocet as needed for breakthrough pain, it just has to be 6 hours after last dose of Tylenol.  Additionally, I prescribed doxycycline, an antibiotic to take twice daily for the next 7 days.  This will aid in preventing reinfection or worsening infection.  If you have any worsening pain, swelling, redness, bloodsoaked bandages, or fever, please return to the nearest emergency department for evaluation.  I have included a referral to general surgery, please call to schedule an appointment.

## 2021-08-02 NOTE — ED Provider Notes (Signed)
[facility] EMERGENCY DEPARTMENT Provider Note   CSN: [id number] Arrival date & time: 08/02/21  6222     History Chief Complaint  Patient presents with   Abscess   Dawn Gould is a 31 y.o. female with history of right axilla abscess presents the emergency department for evaluation of left axilla abscess gradually worsening for the past 3 days.  She reports she has tried warm compresses but is not able to get it to come to ahead.  No discharge has been able to be expressed.  She denies any fever, numbness, tingling, weakness, chest pain, or shortness of breath.  She reports she is seen surgery for her right axilla before, but they mentioned that they would not do anything until she gets a recurrence of this.  Her medical history includes asthma.  She denies any surgical history.  No daily medications.  Allergic to tramadol.  She smokes occasional black and milds.  No EtOH or illicit drug use ever.   Abscess     Home Medications Prior to Admission medications   Medication Sig Start Date End Date Taking? Authorizing Provider  hydrochlorothiazide (HYDRODIURIL) 25 MG tablet Take 1 tablet (25 mg total) by mouth daily. 02/05/19   Raelyn Mora, CNM  ibuprofen (ADVIL) 600 MG tablet Take 1 tablet (600 mg total) by mouth every 6 (six) hours as needed for fever or headache. 01/28/19   Constant, Peggy, MD  NIFEdipine (PROCARDIA XL) 30 MG 24 hr tablet Take 1 tablet (30 mg total) by mouth daily. 02/05/19   Raelyn Mora, CNM  Prenatal Vit-Fe Fumarate-FA (PRENATAL MULTIVITAMIN) TABS tablet Take 1 tablet by mouth daily at 12 noon. 01/28/19   Constant, Peggy, MD      Allergies    Tramadol    Review of Systems   Review of Systems  Skin:        Reports abscess   See HPI  Physical Exam Updated Vital Signs BP 121/87    Pulse 72    Temp 98.1 F (36.7 C) (Oral)    Resp 18    Ht 5\' 3"  (1.6 m)    Wt 84.8 kg    SpO2 100%    BMI 33.13 kg/m  Physical Exam Vitals and nursing note reviewed.   Constitutional:      General: She is not in acute distress.    Appearance: Normal appearance. She is not toxic-appearing.  HENT:     Head: Normocephalic and atraumatic.  Eyes:     General: No scleral icterus. Cardiovascular:     Rate and Rhythm: Normal rate and regular rhythm.  Pulmonary:     Effort: Pulmonary effort is normal. No respiratory distress.  Musculoskeletal:        General: No deformity.     Cervical back: Normal range of motion.  Skin:    General: Skin is warm and dry.     Comments: Approximately 5cm x3cm area of fluctuance noted at the axilla fold on the chest on the left. There are two smaller separate areas of induration inferior to the fluctuant area. No purulent head or active discharge noted. No red streaking. Compartments are soft.   Neurological:     General: No focal deficit present.     Mental Status: She is alert. Mental status is at baseline.     Motor: No weakness.    ED Results / Procedures / Treatments   Labs (all labs ordered are listed, but only abnormal results are displayed) Labs Reviewed  PREGNANCY,  URINE    EKG None  Radiology No results found.  Procedures .Marland KitchenIncision and Drainage  Date/Time: 08/02/2021 10:39 AM Performed by: Achille Rich, PA-C Authorized by: Achille Rich, PA-C   Consent:    Consent obtained:  Verbal   Consent given by:  Patient   Risks discussed:  Bleeding, incomplete drainage, pain, damage to other organs and infection   Alternatives discussed:  No treatment Universal protocol:    Procedure explained and questions answered to patient or proxy's satisfaction: yes     Relevant documents present and verified: no     Test results available : no     Imaging studies available: no     Required blood products, implants, devices, and special equipment available: no     Site/side marked: no     Immediately prior to procedure, a time out was called: no     Patient identity confirmed:  Verbally with patient Location:     Type:  Abscess   Location:  Upper extremity   Upper extremity location: Left axilla. Pre-procedure details:    Skin preparation:  Betadine Sedation:    Sedation type:  None Anesthesia:    Anesthesia method:  Local infiltration   Local anesthetic:  Lidocaine 1% w/o epi Procedure type:    Complexity:  Complex Procedure details:    Incision types:  Stab incision   Incision depth:  Subcutaneous   Wound management:  Probed and deloculated, irrigated with saline and extensive cleaning   Drainage:  Purulent and bloody   Drainage amount:  Moderate   Packing materials:  1/4 in gauze Post-procedure details:    Procedure completion:  Tolerated well, no immediate complications Comments:     1 stab incision was made in the middle area of induration as when I was numbing the area up with lidocaine and some thick white purulence came out.  Stab incision was made at the area and no purulence was expressed.  Continued with the stab incision to the most superior area with a fluctuance where the sanguinous purulent discharge was expressed.   Medications Ordered in ED Medications  ketorolac (TORADOL) injection 30 mg (has no administration in time range)  lidocaine (PF) (XYLOCAINE) 1 % injection 10 mL (10 mLs Intradermal Given 08/02/21 4081)    ED Course/ Medical Decision Making/ A&P                           Medical Decision Making Amount and/or Complexity of Data Reviewed Labs: ordered.  Risk Prescription drug management.   31 year old female presents emerged department for evaluation of abscess to the left axilla.  Differential diagnosis includes folliculitis, induration, lipoma, abscess, deep space infection, hidradenitis suppurativa.  Vital signs stable.  Patient mildly hypertensive at 127 or 93.  Afebrile, normal pulse rate, satting well on room air without any increased work of breathing.  Physical exam shows an approximately 5 cm x 3 cm area of fluctuance with some surrounding  induration.  The patient has 2 separate areas of induration inferior to the axilla fold and inferior to this fluctuant area.  Likely separate potential abscesses in the future.  Will obtain urine pregnancy and prep for I&D procedure.  At this time, I doubt any deep space infection.  The patient has had this problem before and reports that she stopped using deodorant and only uses cornstarch for this.  She reports that she does not shave her armpits.  This may be folliculitis or hidradenitis  suppurativa, regardless we will send general surgery referral.  The patient tolerated the procedure well.  Moderate amount of sanguinous purulent fluid expressed with suction.  Abscess was deloculated and was irrigated with 100 mL of normal saline.  Quarter inch iodine packing placed.  See procedure note for further detail.  We will send the patient home on doxycycline for antibiotic coverage.  We will also send her home on a short course of Percocet for her pain.  Recommended using that only for breakthrough pain and to use Tylenol/ibuprofen for pain.  Recommended warm compresses.  I discussed with her that the lower to areas may need to be drained at some point, but do not need to be drained now.  I discussed with her to return to the ER in 48 hours for removal of the packing in for evaluation of her this abscess and the 2 below it.  Strict return precautions discussed such as worsening redness, swelling, pain, bloodsoaked bandages, etc. The patient verbalized and read back my    Final Clinical Impression(s) / ED Diagnoses Final diagnoses:  Abscess of left axilla    Rx / DC Orders ED Discharge Orders          Ordered    doxycycline (VIBRAMYCIN) 100 MG capsule  2 times daily        08/02/21 1057    oxyCODONE-acetaminophen (PERCOCET/ROXICET) 5-325 MG tablet  Every 6 hours PRN        08/02/21 1057              Achille Rich, PA-C 08/02/21 1057    Nixon, DO 08/02/21 1135

## 2021-08-02 NOTE — ED Triage Notes (Signed)
Pt has abscess under left armpit, history of same on other arm. Hurts to move arm.

## 2021-08-04 ENCOUNTER — Other Ambulatory Visit: Payer: Self-pay

## 2021-08-04 ENCOUNTER — Encounter (HOSPITAL_BASED_OUTPATIENT_CLINIC_OR_DEPARTMENT_OTHER): Payer: Self-pay | Admitting: Emergency Medicine

## 2021-08-04 ENCOUNTER — Emergency Department (HOSPITAL_BASED_OUTPATIENT_CLINIC_OR_DEPARTMENT_OTHER)
Admission: EM | Admit: 2021-08-04 | Discharge: 2021-08-04 | Disposition: A | Discharge status: Home / Self Care | Payer: Medicaid Other | Attending: Emergency Medicine | Admitting: Emergency Medicine

## 2021-08-04 DIAGNOSIS — L732 Hidradenitis suppurativa: Secondary | ICD-10-CM

## 2021-08-04 DIAGNOSIS — Z4801 Encounter for change or removal of surgical wound dressing: Secondary | ICD-10-CM | POA: Insufficient documentation

## 2021-08-04 DIAGNOSIS — Z5189 Encounter for other specified aftercare: Secondary | ICD-10-CM

## 2021-08-04 NOTE — ED Provider Notes (Signed)
?[facility] EMERGENCY DEPARTMENT ?Provider Note ? ? ?CSN: [id number] ?Arrival date & time: 08/04/21  0910 ? ?  ? ?History ? ?Chief Complaint  ?Patient presents with  ? Wound Check  ? ? ?Dawn Gould is a 31 y.o. female. ? ?Patient here for removal of packing from left axilla area I&D that occurred on February 28.  Patient states pain is decreased.  Patient's had multiple abscesses in the left axillary area.  As well as her right axillary area. ? ?Past medical history sniffing for asthma back pain. ? ? ?  ? ?Home Medications ?Prior to Admission medications   ?Medication Sig Start Date End Date Taking? Authorizing Provider  ?doxycycline (VIBRAMYCIN) 100 MG capsule Take 1 capsule (100 mg total) by mouth 2 (two) times daily. 08/02/21   Achille Rich, PA-C  ?oxyCODONE-acetaminophen (PERCOCET/ROXICET) 5-325 MG tablet Take 1 tablet by mouth every 6 (six) hours as needed for severe pain. 08/02/21   Achille Rich, PA-C  ?   ? ?Allergies    ?Tramadol   ? ?Review of Systems   ?Review of Systems  ?Constitutional:  Negative for chills and fever.  ?HENT:  Negative for ear pain and sore throat.   ?Eyes:  Negative for pain and visual disturbance.  ?Respiratory:  Negative for cough and shortness of breath.   ?Cardiovascular:  Negative for chest pain and palpitations.  ?Gastrointestinal:  Negative for abdominal pain and vomiting.  ?Genitourinary:  Negative for dysuria and hematuria.  ?Musculoskeletal:  Negative for arthralgias and back pain.  ?Skin:  Positive for wound. Negative for color change and rash.  ?Neurological:  Negative for seizures and syncope.  ?All other systems reviewed and are negative. ? ?Physical Exam ?Updated Vital Signs ?BP 121/79   Pulse 72   Temp 98.1 ?F (36.7 ?C) (Oral)   Resp 16   SpO2 100%  ?Physical Exam ?Vitals and nursing note reviewed.  ?Constitutional:   ?   General: She is not in acute distress. ?   Appearance: Normal appearance. She is well-developed.  ?HENT:  ?   Head: Normocephalic and  atraumatic.  ?Eyes:  ?   Extraocular Movements: Extraocular movements intact.  ?   Conjunctiva/sclera: Conjunctivae normal.  ?   Pupils: Pupils are equal, round, and reactive to light.  ?Cardiovascular:  ?   Rate and Rhythm: Normal rate and regular rhythm.  ?   Heart sounds: No murmur heard. ?Pulmonary:  ?   Effort: Pulmonary effort is normal. No respiratory distress.  ?   Breath sounds: Normal breath sounds.  ?Abdominal:  ?   Palpations: Abdomen is soft.  ?   Tenderness: There is no abdominal tenderness.  ?Musculoskeletal:     ?   General: Swelling and tenderness present.  ?   Cervical back: Normal range of motion and neck supple.  ?   Comments: Left axillary area packing in place.  Still little bit of purulent discharge but patient states swelling significantly down.  Lots of scars from previous I&D.  ?Skin: ?   General: Skin is warm and dry.  ?   Capillary Refill: Capillary refill takes less than 2 seconds.  ?Neurological:  ?   General: No focal deficit present.  ?   Mental Status: She is alert and oriented to person, place, and time.  ?Psychiatric:     ?   Mood and Affect: Mood normal.  ? ? ?ED Results / Procedures / Treatments   ?Labs ?(all labs ordered are listed, but only abnormal results are  displayed) ?Labs Reviewed - No data to display ? ?EKG ?None ? ?Radiology ?No results found. ? ?Procedures ?Procedures  ? ? ?Medications Ordered in ED ?Medications - No data to display ? ?ED Course/ Medical Decision Making/ A&P ?  ?                        ?Medical Decision Making ?Symptoms consistent with hidradenitis.  Packing removed without any difficulty.  Little bit of purulent discharge.  Will have patient follow-up with primary care doctor.  Patient is currently on antibiotics and recommend that she can continue them and take them as directed. ? ?Discussed with patient possible need for excision of the area that these abscesses will continue to recur.  We will have her follow-up with her primary care  doctor. ? ? ?Final Clinical Impression(s) / ED Diagnoses ?Final diagnoses:  ?Wound check, abscess  ?Hidradenitis  ? ? ?Rx / DC Orders ?ED Discharge Orders   ? ? None  ? ?  ? ? ?  ?Vanetta Mulders, MD ?08/04/21 1022 ? ?

## 2021-08-04 NOTE — ED Triage Notes (Signed)
Pt coming in to have packing removed in L axilla after having abscess lanced 2 days ago. Pain has decreased since abscess lanced.  ?

## 2021-08-04 NOTE — Discharge Instructions (Addendum)
Continue antibiotic make an appointment follow-up with your primary care doctor.  I think your recurrent abscesses in your axillary area secondary to hidradenitis.  Consideration should be given to maybe long-term antibiotic suppression or either excision of the area. ?

## 2021-08-19 ENCOUNTER — Encounter (HOSPITAL_BASED_OUTPATIENT_CLINIC_OR_DEPARTMENT_OTHER): Payer: Self-pay

## 2021-08-19 ENCOUNTER — Emergency Department (HOSPITAL_BASED_OUTPATIENT_CLINIC_OR_DEPARTMENT_OTHER)
Admission: EM | Admit: 2021-08-19 | Discharge: 2021-08-19 | Disposition: A | Discharge status: Home / Self Care | Payer: Medicaid Other | Attending: Emergency Medicine | Admitting: Emergency Medicine

## 2021-08-19 ENCOUNTER — Other Ambulatory Visit: Payer: Self-pay

## 2021-08-19 DIAGNOSIS — Z20822 Contact with and (suspected) exposure to covid-19: Secondary | ICD-10-CM | POA: Insufficient documentation

## 2021-08-19 DIAGNOSIS — J020 Streptococcal pharyngitis: Secondary | ICD-10-CM | POA: Diagnosis not present

## 2021-08-19 DIAGNOSIS — J45909 Unspecified asthma, uncomplicated: Secondary | ICD-10-CM | POA: Insufficient documentation

## 2021-08-19 DIAGNOSIS — J029 Acute pharyngitis, unspecified: Secondary | ICD-10-CM | POA: Diagnosis present

## 2021-08-19 LAB — GROUP A STREP BY PCR: Group A Strep by PCR: DETECTED — AB

## 2021-08-19 LAB — RESP PANEL BY RT-PCR (FLU A&B, COVID) ARPGX2
Influenza A by PCR: NEGATIVE
Influenza B by PCR: NEGATIVE
SARS Coronavirus 2 by RT PCR: NEGATIVE

## 2021-08-19 MED ORDER — AMOXICILLIN 500 MG PO CAPS: 500.0000 mg | ORAL_CAPSULE | Freq: Two times a day (BID) | ORAL | 0 refills | Status: DC

## 2021-08-19 MED ORDER — AMOXICILLIN 500 MG PO CAPS: 500.0000 mg | ORAL_CAPSULE | Freq: Two times a day (BID) | ORAL | 0 refills | Status: AC

## 2021-08-19 NOTE — ED Triage Notes (Signed)
Pt arrives ambulatory to ED with c/o sore throat X3 days. Pt also reports some pain in abdomen when coughing. Pt was around someone with flu last week.  ?

## 2021-08-19 NOTE — Discharge Instructions (Addendum)
You are seen in the emergency room today with sore throat.  Your strep test does come back positive.  I have called in antibiotic to your pharmacy.  You need to take this for the next week.  Please take the full course of antibiotic even if you are feeling better soon.  If you take oral contraception medication to prevent pregnancy, antibiotics can make this less effective.  ?

## 2021-08-19 NOTE — ED Notes (Signed)
Pt requesting to self swab for covid and strep explained to patient this needed to be done by staff to ensure a good sample. Pt agreed to allow this RN to continue with swabs. During covid swab patient grabbed nurse when attempting to swab. Offered patient tissues following swab. Pt declined.  ?

## 2021-08-19 NOTE — ED Provider Notes (Signed)
? ?Emergency Department Provider Note ? ? ?I have reviewed the triage vital signs and the nursing notes. ? ? ?HISTORY ? ?Chief Complaint ?Sore Throat ? ? ?HPI ?Dawn Gould is a 31 y.o. female  presents to the ED with 3 days of sore throat. Patient has some associated cough and abdominal pain but notes this is only with cough. No CP or SOB. No fever. No chills. No change in voice or difficulty swallowing. Does have pain with swallowing.  ? ? ?Past Medical History:  ?Diagnosis Date  ? Asthma   ? Back pain   ? ? ?Review of Systems ? ?Constitutional: No fever/chills ?Eyes: No visual changes. ?ENT: Positive sore throat. ?Cardiovascular: Denies chest pain. ?Respiratory: Denies shortness of breath. Mild cough.  ?Gastrointestinal: Positive abdominal pain with coughing only.  No nausea, no vomiting.  No diarrhea.  No constipation. ?Genitourinary: Negative for dysuria. ?Musculoskeletal: Negative for back pain. ?Skin: Negative for rash. ?Neurological: Negative for headaches.  ? ?____________________________________________ ? ? ?PHYSICAL EXAM: ? ?VITAL SIGNS: ?ED Triage Vitals  ?Enc Vitals Group  ?   BP 08/19/21 0817 131/89  ?   Pulse Rate 08/19/21 0817 84  ?   Resp 08/19/21 0817 15  ?   Temp 08/19/21 0817 99 ?F (37.2 ?C)  ?   Temp Source 08/19/21 0817 Oral  ?   SpO2 08/19/21 0817 100 %  ?   Weight 08/19/21 0814 186 lb (84.4 kg)  ?   Height 08/19/21 0814 5\' 3"  (1.6 m)  ? ?Constitutional: Alert and oriented. Well appearing and in no acute distress. ?Eyes: Conjunctivae are normal. ?Head: Atraumatic. ?Nose: No congestion/rhinnorhea. ?Mouth/Throat: Mucous membranes are moist.  Oropharynx with erythema. No PTA. Clear voice. Managing oral secretions. No trismus.  ?Neck: No stridor. ?Cardiovascular: Normal rate, regular rhythm. Good peripheral circulation. Grossly normal heart sounds.   ?Respiratory: Normal respiratory effort.  No retractions. Lungs CTAB. ?Gastrointestinal: Soft and nontender. No distention.  ?Musculoskeletal: No  lower extremity tenderness nor edema. No gross deformities of extremities. ?Neurologic:  Normal speech and language. No gross focal neurologic deficits are appreciated.  ?Skin:  Skin is warm, dry and intact. No rash noted. ? ?____________________________________________ ?  ?LABS ?(all labs ordered are listed, but only abnormal results are displayed) ? ?Labs Reviewed  ?GROUP A STREP BY PCR - Abnormal; Notable for the following components:  ?    Result Value  ? Group A Strep by PCR DETECTED (*)   ? All other components within normal limits  ?RESP PANEL BY RT-PCR (FLU A&B, COVID) [id number]  ? ? ?____________________________________________ ? ? ?PROCEDURES ? ?Procedure(s) performed:  ? ?Procedures ? ?None ?____________________________________________ ? ? ?INITIAL IMPRESSION / ASSESSMENT AND PLAN / ED COURSE ? ?Pertinent labs & imaging results that were available during my care of the patient were reviewed by me and considered in my medical decision making (see chart for details). ?  ?  ?Clinical Laboratory Tests Ordered, included COVID and Flu PCR negative. Strep PCR positive.  ? ?Medical Decision Making: Summary:  ?Patient presents to the ED with sore throat. No trismus. No sign of deep infection including PTA, retropharyngeal abscess, or Ludwig's. Plan for abx and PCP follow up. ? ?Reevaluation with update and discussion with discussed results and treatment plan. Discussed ED return precautions.  ? ?Disposition: discharge ? ?____________________________________________ ? ?FINAL CLINICAL IMPRESSION(S) / ED DIAGNOSES ? ?Final diagnoses:  ?Strep throat  ? ? ?NEW OUTPATIENT MEDICATIONS STARTED DURING THIS VISIT: ? ?Discharge Medication List as of 08/19/2021  8:53 AM  ?  ? ?  START taking these medications  ? Details  ?amoxicillin (AMOXIL) 500 MG capsule Take 1 capsule (500 mg total) by mouth 2 (two) times daily for 7 days., Starting Fri 08/19/2021, Until Fri 08/26/2021, Normal  ?  ?  ? ? ?Note:  This document was prepared using  Dragon voice recognition software and may include unintentional dictation errors. ? ?Alona Bene, MD, FACEP ?Emergency Medicine ? ?  ?Maia Plan, MD ?08/23/21 1151 ? ?

## 2021-10-10 ENCOUNTER — Other Ambulatory Visit: Payer: Self-pay

## 2021-10-10 ENCOUNTER — Emergency Department (HOSPITAL_BASED_OUTPATIENT_CLINIC_OR_DEPARTMENT_OTHER)
Admission: EM | Admit: 2021-10-10 | Discharge: 2021-10-10 | Disposition: A | Discharge status: Home / Self Care | Payer: Medicaid Other | Attending: Emergency Medicine | Admitting: Emergency Medicine

## 2021-10-10 ENCOUNTER — Other Ambulatory Visit (HOSPITAL_BASED_OUTPATIENT_CLINIC_OR_DEPARTMENT_OTHER): Payer: Self-pay

## 2021-10-10 ENCOUNTER — Encounter (HOSPITAL_BASED_OUTPATIENT_CLINIC_OR_DEPARTMENT_OTHER): Payer: Self-pay | Admitting: Radiology

## 2021-10-10 ENCOUNTER — Emergency Department (HOSPITAL_BASED_OUTPATIENT_CLINIC_OR_DEPARTMENT_OTHER): Payer: Medicaid Other

## 2021-10-10 DIAGNOSIS — J45909 Unspecified asthma, uncomplicated: Secondary | ICD-10-CM | POA: Diagnosis not present

## 2021-10-10 DIAGNOSIS — R1032 Left lower quadrant pain: Secondary | ICD-10-CM | POA: Diagnosis present

## 2021-10-10 LAB — COMPREHENSIVE METABOLIC PANEL
ALT: 21 U/L (ref 0–44)
AST: 22 U/L (ref 15–41)
Albumin: 3.4 g/dL — ABNORMAL LOW (ref 3.5–5.0)
Alkaline Phosphatase: 80 U/L (ref 38–126)
Anion gap: 4 — ABNORMAL LOW (ref 5–15)
BUN: 14 mg/dL (ref 6–20)
CO2: 24 mmol/L (ref 22–32)
Calcium: 8.5 mg/dL — ABNORMAL LOW (ref 8.9–10.3)
Chloride: 111 mmol/L (ref 98–111)
Creatinine, Ser: 0.79 mg/dL (ref 0.44–1.00)
GFR, Estimated: >60 mL/min (ref >60)
Glucose, Bld: 109 mg/dL — ABNORMAL HIGH (ref 70–99)
Potassium: 3.7 mmol/L (ref 3.5–5.1)
Sodium: 139 mmol/L (ref 135–145)
Total Bilirubin: <0.1 mg/dL — ABNORMAL LOW (ref 0.3–1.2)
Total Protein: 7.3 g/dL (ref 6.5–8.1)

## 2021-10-10 LAB — CBC WITH DIFFERENTIAL/PLATELET
Abs Immature Granulocytes: 0.03 10*3/uL (ref 0.00–0.07)
Basophils Absolute: 0 10*3/uL (ref 0.0–0.1)
Basophils Relative: 0 %
Eosinophils Absolute: 0.2 10*3/uL (ref 0.0–0.5)
Eosinophils Relative: 4 %
HCT: 39.3 % (ref 36.0–46.0)
Hemoglobin: 12.4 g/dL (ref 12.0–15.0)
Immature Granulocytes: 1 %
Lymphocytes Relative: 36 %
Lymphs Abs: 2 10*3/uL (ref 0.7–4.0)
MCH: 27.3 pg (ref 26.0–34.0)
MCHC: 31.6 g/dL (ref 30.0–36.0)
MCV: 86.6 fL (ref 80.0–100.0)
Monocytes Absolute: 0.4 10*3/uL (ref 0.1–1.0)
Monocytes Relative: 8 %
Neutro Abs: 2.8 10*3/uL (ref 1.7–7.7)
Neutrophils Relative %: 51 %
Platelets: 212 10*3/uL (ref 150–400)
RBC: 4.54 MIL/uL (ref 3.87–5.11)
RDW: 15.6 % — ABNORMAL HIGH (ref 11.5–15.5)
WBC: 5.4 10*3/uL (ref 4.0–10.5)
nRBC: 0 % (ref 0.0–0.2)

## 2021-10-10 LAB — LIPASE, BLOOD: Lipase: 40 U/L (ref 11–51)

## 2021-10-10 LAB — URINALYSIS, ROUTINE W REFLEX MICROSCOPIC
Bilirubin Urine: NEGATIVE
Glucose, UA: NEGATIVE mg/dL
Ketones, ur: NEGATIVE mg/dL
Leukocytes,Ua: NEGATIVE
Nitrite: NEGATIVE
Protein, ur: NEGATIVE mg/dL
Specific Gravity, Urine: >=1.03 (ref 1.005–1.030)
pH: 6 (ref 5.0–8.0)

## 2021-10-10 LAB — URINALYSIS, MICROSCOPIC (REFLEX)

## 2021-10-10 LAB — PREGNANCY, URINE: Preg Test, Ur: NEGATIVE

## 2021-10-10 MED ORDER — IOHEXOL 300 MG/ML  SOLN
100.0000 mL | Freq: Once | INTRAMUSCULAR | Status: AC | PRN
  Administered 2021-10-10: 100 mL via INTRAVENOUS

## 2021-10-10 MED ORDER — SODIUM CHLORIDE 0.9 % IV SOLN: INTRAVENOUS | Status: DC

## 2021-10-10 MED ORDER — NAPROXEN 500 MG PO TABS
500.0000 mg | ORAL_TABLET | Freq: Two times a day (BID) | ORAL | 0 refills | Status: DC
  Filled 2021-10-10: qty 14, 7d supply, fill #0

## 2021-10-10 NOTE — Discharge Instructions (Signed)
Work-up Labs normal CT scan abdomen pelvis normal.  Take the Naprosyn as directed.  Work note provided.  Return for any new or worse symptoms or follow-up with your doctor. ?

## 2021-10-10 NOTE — ED Triage Notes (Signed)
Pt c/o left lower side abdominal pain worse with deep breaths x 2 days. Pt states "I think it may be my asthma and so I took my inhaler and it helped some". Pt also endorses running on Friday. Denies SOB, no coughing, no fever. ?

## 2021-10-10 NOTE — ED Provider Notes (Signed)
?[facility] EMERGENCY DEPARTMENT ?Provider Note ? ? ?CSN: [id number] ?Arrival date & time: 10/10/21  0802 ? ?  ? ?History ? ?Chief Complaint  ?Patient presents with  ? Abdominal Pain  ? ? ?Dawn Gould is a 31 y.o. female. ? ?Patient with a complaint of left-sided abdominal pain more so left lower quadrant for the past 2 days.  Not associated with any nausea or vomiting or diarrhea.  No history of kidney stones no history of similar pain.  Not hurting to pee no blood in the urine.  Does hurt some to take a deep breath.  But no chest pain no shortness of breath.  Patient does have a past medical history consistent for back pain and asthma. ? ? ?  ? ?Home Medications ?Prior to Admission medications   ?Medication Sig Start Date End Date Taking? Authorizing Provider  ?doxycycline (VIBRAMYCIN) 100 MG capsule Take 1 capsule (100 mg total) by mouth 2 (two) times daily. 08/02/21   Achille Rich, PA-C  ?oxyCODONE-acetaminophen (PERCOCET/ROXICET) 5-325 MG tablet Take 1 tablet by mouth every 6 (six) hours as needed for severe pain. 08/02/21   Achille Rich, PA-C  ?   ? ?Allergies    ?Tramadol   ? ?Review of Systems   ?Review of Systems  ?Constitutional:  Negative for chills and fever.  ?HENT:  Negative for ear pain and sore throat.   ?Eyes:  Negative for pain and visual disturbance.  ?Respiratory:  Negative for cough and shortness of breath.   ?Cardiovascular:  Negative for chest pain and palpitations.  ?Gastrointestinal:  Positive for abdominal pain. Negative for diarrhea and nausea.  ?Genitourinary:  Negative for dysuria and hematuria.  ?Musculoskeletal:  Negative for arthralgias and back pain.  ?Skin:  Negative for color change and rash.  ?Neurological:  Negative for seizures and syncope.  ?All other systems reviewed and are negative. ? ?Physical Exam ?Updated Vital Signs ?BP (!) 128/91 (BP Location: Right Arm)   Pulse 65   Temp 98.3 ?F (36.8 ?C) (Oral)   Resp 16   Ht 1.6 m (5\' 3" )   Wt 85.7 kg   SpO2 97%   BMI  33.48 kg/m?  ?Physical Exam ?Vitals and nursing note reviewed.  ?Constitutional:   ?   General: She is not in acute distress. ?   Appearance: Normal appearance. She is well-developed.  ?HENT:  ?   Head: Normocephalic and atraumatic.  ?Eyes:  ?   Conjunctiva/sclera: Conjunctivae normal.  ?Cardiovascular:  ?   Rate and Rhythm: Normal rate and regular rhythm.  ?   Heart sounds: No murmur heard. ?Pulmonary:  ?   Effort: Pulmonary effort is normal. No respiratory distress.  ?   Breath sounds: Normal breath sounds. No wheezing.  ?Abdominal:  ?   Palpations: Abdomen is soft.  ?   Tenderness: There is abdominal tenderness. There is no guarding.  ?   Comments: Tender left lower quadrant no guarding  ?Musculoskeletal:     ?   General: No swelling.  ?   Cervical back: Neck supple.  ?Skin: ?   General: Skin is warm and dry.  ?   Capillary Refill: Capillary refill takes less than 2 seconds.  ?Neurological:  ?   General: No focal deficit present.  ?   Mental Status: She is alert and oriented to person, place, and time.  ?Psychiatric:     ?   Mood and Affect: Mood normal.  ? ? ?ED Results / Procedures / Treatments   ?Labs ?(  all labs ordered are listed, but only abnormal results are displayed) ?Labs Reviewed  ?PREGNANCY, URINE  ?URINALYSIS, ROUTINE W REFLEX MICROSCOPIC  ?COMPREHENSIVE METABOLIC PANEL  ?LIPASE, BLOOD  ?CBC WITH DIFFERENTIAL/PLATELET  ? ? ?EKG ?None ? ?Radiology ?No results found. ? ?Procedures ?Procedures  ? ? ?Medications Ordered in ED ?Medications  ?0.9 %  sodium chloride infusion (has no administration in time range)  ? ? ?ED Course/ Medical Decision Making/ A&P ?  ?                        ?Medical Decision Making ?Amount and/or Complexity of Data Reviewed ?Labs: ordered. ?Radiology: ordered. ? ?Risk ?Prescription drug management. ? ?Patient with left-sided pain of the abdomen prickly and left lower quadrant.  Will require CT scan to evaluate further.  Will check pregnancy test as well.  Get CBC complete  metabolic panel and lipase. ? ?Patient's left-sided abdominal pain on CT scan without any explanation.  Labs also very reassuring pregnancy test negative urinalysis negative complete metabolic panel without any acute abnormalities.  Lipase also normal at 40.  No leukocytosis on CBC white count 5.4 hemoglobin is 12.4. ? ?We will treat patient symptomatically. ? ? ?Final Clinical Impression(s) / ED Diagnoses ?Final diagnoses:  ?Left lower quadrant abdominal pain  ? ? ?Rx / DC Orders ?ED Discharge Orders   ? ? None  ? ?  ? ? ?  ?Vanetta Mulders, MD ?10/10/21 1008 ? ?

## 2021-10-17 ENCOUNTER — Other Ambulatory Visit (HOSPITAL_BASED_OUTPATIENT_CLINIC_OR_DEPARTMENT_OTHER): Payer: Self-pay

## 2021-12-02 ENCOUNTER — Encounter (HOSPITAL_BASED_OUTPATIENT_CLINIC_OR_DEPARTMENT_OTHER): Payer: Self-pay | Admitting: Emergency Medicine

## 2021-12-02 ENCOUNTER — Emergency Department (HOSPITAL_BASED_OUTPATIENT_CLINIC_OR_DEPARTMENT_OTHER)
Admission: EM | Admit: 2021-12-02 | Discharge: 2021-12-02 | Disposition: A | Discharge status: Home / Self Care | Payer: Medicaid Other | Attending: Emergency Medicine | Admitting: Emergency Medicine

## 2021-12-02 DIAGNOSIS — R509 Fever, unspecified: Secondary | ICD-10-CM | POA: Insufficient documentation

## 2021-12-02 DIAGNOSIS — J029 Acute pharyngitis, unspecified: Secondary | ICD-10-CM

## 2021-12-02 DIAGNOSIS — Z20822 Contact with and (suspected) exposure to covid-19: Secondary | ICD-10-CM | POA: Diagnosis not present

## 2021-12-02 LAB — SARS CORONAVIRUS 2 BY RT PCR: SARS Coronavirus 2 by RT PCR: NEGATIVE

## 2021-12-02 LAB — GROUP A STREP BY PCR: Group A Strep by PCR: NOT DETECTED

## 2021-12-02 MED ORDER — PENICILLIN G BENZATHINE 1200000 UNIT/2ML IM SUSY
1.2000 10*6.[IU] | PREFILLED_SYRINGE | Freq: Once | INTRAMUSCULAR | Status: AC
  Administered 2021-12-02: 1.2 10*6.[IU] via INTRAMUSCULAR
  Filled 2021-12-02: qty 2

## 2021-12-02 MED ORDER — DEXAMETHASONE 4 MG PO TABS
10.0000 mg | ORAL_TABLET | Freq: Once | ORAL | Status: AC
  Administered 2021-12-02: 10 mg via ORAL
  Filled 2021-12-02: qty 3

## 2021-12-02 NOTE — ED Provider Notes (Signed)
[facility] EMERGENCY DEPARTMENT Provider Note   CSN: [id number] Arrival date & time: 12/02/21  0736     History  Chief Complaint  Patient presents with   Sore Throat    Dawn Gould is a 31 y.o. female.  Patient here with sore throat for the last 3 days.  Pain with swallowing.  Has noticed some spots on on the back of her throat.  Low-grade fever.  No neck pain, chest pain, shortness of breath, abdominal pain, nausea or vomiting.  No allergy history.  Currently on her menstrual cycle.  Swallowing hurts.  Nothing makes it better.  Denies any ear pain, neck pain, no swelling of the tongue or lips.  The history is provided by the patient.       Home Medications Prior to Admission medications   Medication Sig Start Date End Date Taking? Authorizing Provider  doxycycline (VIBRAMYCIN) 100 MG capsule Take 1 capsule (100 mg total) by mouth 2 (two) times daily. 08/02/21   Achille Rich, PA-C  naproxen (NAPROSYN) 500 MG tablet Take 1 tablet (500 mg total) by mouth 2 (two) times daily. 10/10/21   Vanetta Mulders, MD  oxyCODONE-acetaminophen (PERCOCET/ROXICET) 5-325 MG tablet Take 1 tablet by mouth every 6 (six) hours as needed for severe pain. 08/02/21   Achille Rich, PA-C      Allergies    Tramadol    Review of Systems   Review of Systems  Physical Exam Updated Vital Signs BP 125/85   Pulse 96   Temp 99.3 F (37.4 C) (Oral)   Resp 18   SpO2 100%  Physical Exam Vitals and nursing note reviewed.  Constitutional:      General: She is not in acute distress.    Appearance: She is well-developed.  HENT:     Head: Normocephalic and atraumatic.     Right Ear: Tympanic membrane normal.     Left Ear: Tympanic membrane normal.     Mouth/Throat:     Mouth: No oral lesions.     Pharynx: Oropharyngeal exudate and posterior oropharyngeal erythema present. No pharyngeal swelling or uvula swelling.     Tonsils: Tonsillar exudate present. No tonsillar abscesses.  Eyes:      Conjunctiva/sclera: Conjunctivae normal.  Cardiovascular:     Rate and Rhythm: Normal rate and regular rhythm.     Heart sounds: No murmur heard. Pulmonary:     Effort: Pulmonary effort is normal. No respiratory distress.     Breath sounds: Normal breath sounds.  Abdominal:     Palpations: Abdomen is soft.     Tenderness: There is no abdominal tenderness.  Musculoskeletal:        General: No swelling.     Cervical back: Neck supple.  Skin:    General: Skin is warm and dry.     Capillary Refill: Capillary refill takes less than 2 seconds.  Neurological:     Mental Status: She is alert.  Psychiatric:        Mood and Affect: Mood normal.     ED Results / Procedures / Treatments   Labs (all labs ordered are listed, but only abnormal results are displayed) Labs Reviewed  GROUP A STREP BY PCR  SARS CORONAVIRUS 2 BY RT PCR    EKG None  Radiology No results found.  Procedures Procedures    Medications Ordered in ED Medications  dexamethasone (DECADRON) tablet 10 mg (has no administration in time range)  penicillin g benzathine (BICILLIN LA) 1200000 UNIT/2ML injection 1.2 Million  Units (has no administration in time range)    ED Course/ Medical Decision Making/ A&P                           Medical Decision Making Risk Prescription drug management.   Cydni Burford is here with sore throat.  Normal vitals.  No fever.  Clinically looks like she has strep pharyngitis.  Will treat with Decadron and IM penicillin.  I have no concern for deep space abscess or infection.  No trismus or drooling.  Normal voice.  No concern for retropharyngeal abscess or peritonsillar abscess.  She has exudates on both tonsils.  We will test for COVID and strep but empirically treat for strep pharyngitis.  Recommend Tylenol and ibuprofen for pain.  She understands return precautions.  Discharged in good condition.  This chart was dictated using voice recognition software.  Despite best efforts  to proofread,  errors can occur which can change the documentation meaning.         Final Clinical Impression(s) / ED Diagnoses Final diagnoses:  Pharyngitis, unspecified etiology    Rx / DC Orders ED Discharge Orders     None         Virgina Norfolk, DO 12/02/21 [phone]

## 2021-12-02 NOTE — ED Triage Notes (Signed)
Patient states that her throat is sore x 3 days. States that she is having difficulty with swallowing. States that she has white spots on back of throat.

## 2021-12-02 NOTE — Discharge Instructions (Signed)
You have been treated with a long release antibiotic and steroid for strep throat.

## 2021-12-03 ENCOUNTER — Encounter (HOSPITAL_BASED_OUTPATIENT_CLINIC_OR_DEPARTMENT_OTHER): Payer: Self-pay | Admitting: Emergency Medicine

## 2021-12-03 ENCOUNTER — Emergency Department (HOSPITAL_BASED_OUTPATIENT_CLINIC_OR_DEPARTMENT_OTHER): Payer: Medicaid Other

## 2021-12-03 ENCOUNTER — Inpatient Hospital Stay (HOSPITAL_BASED_OUTPATIENT_CLINIC_OR_DEPARTMENT_OTHER)
Admission: EM | Admit: 2021-12-03 | Discharge: 2021-12-05 | DRG: 145 | Disposition: A | Discharge status: Home / Self Care | Payer: Medicaid Other | Attending: Internal Medicine | Admitting: Internal Medicine

## 2021-12-03 ENCOUNTER — Other Ambulatory Visit: Payer: Self-pay

## 2021-12-03 DIAGNOSIS — J039 Acute tonsillitis, unspecified: Secondary | ICD-10-CM | POA: Diagnosis present

## 2021-12-03 DIAGNOSIS — Z833 Family history of diabetes mellitus: Secondary | ICD-10-CM

## 2021-12-03 DIAGNOSIS — F17290 Nicotine dependence, other tobacco product, uncomplicated: Secondary | ICD-10-CM | POA: Diagnosis present

## 2021-12-03 DIAGNOSIS — J45909 Unspecified asthma, uncomplicated: Secondary | ICD-10-CM | POA: Diagnosis present

## 2021-12-03 DIAGNOSIS — J36 Peritonsillar abscess: Principal | ICD-10-CM | POA: Diagnosis present

## 2021-12-03 DIAGNOSIS — Z79899 Other long term (current) drug therapy: Secondary | ICD-10-CM

## 2021-12-03 DIAGNOSIS — Z8249 Family history of ischemic heart disease and other diseases of the circulatory system: Secondary | ICD-10-CM

## 2021-12-03 HISTORY — DX: Hidradenitis suppurativa: L73.2

## 2021-12-03 LAB — PREGNANCY, URINE: Preg Test, Ur: NEGATIVE

## 2021-12-03 LAB — CBC WITH DIFFERENTIAL/PLATELET
Abs Immature Granulocytes: 0.08 10*3/uL — ABNORMAL HIGH (ref 0.00–0.07)
Basophils Absolute: 0 10*3/uL (ref 0.0–0.1)
Basophils Relative: 0 %
Eosinophils Absolute: 0 10*3/uL (ref 0.0–0.5)
Eosinophils Relative: 0 %
HCT: 38 % (ref 36.0–46.0)
Hemoglobin: 12.5 g/dL (ref 12.0–15.0)
Immature Granulocytes: 1 %
Lymphocytes Relative: 18 %
Lymphs Abs: 2.4 10*3/uL (ref 0.7–4.0)
MCH: 27.4 pg (ref 26.0–34.0)
MCHC: 32.9 g/dL (ref 30.0–36.0)
MCV: 83.2 fL (ref 80.0–100.0)
Monocytes Absolute: 0.9 10*3/uL (ref 0.1–1.0)
Monocytes Relative: 7 %
Neutro Abs: 10.2 10*3/uL — ABNORMAL HIGH (ref 1.7–7.7)
Neutrophils Relative %: 74 %
Platelets: 247 10*3/uL (ref 150–400)
RBC: 4.57 MIL/uL (ref 3.87–5.11)
RDW: 13.9 % (ref 11.5–15.5)
WBC: 13.7 10*3/uL — ABNORMAL HIGH (ref 4.0–10.5)
nRBC: 0 % (ref 0.0–0.2)

## 2021-12-03 LAB — BASIC METABOLIC PANEL
Anion gap: 7 (ref 5–15)
BUN: 15 mg/dL (ref 6–20)
CO2: 24 mmol/L (ref 22–32)
Calcium: 8.5 mg/dL — ABNORMAL LOW (ref 8.9–10.3)
Chloride: 107 mmol/L (ref 98–111)
Creatinine, Ser: 0.86 mg/dL (ref 0.44–1.00)
GFR, Estimated: >60 mL/min (ref >60)
Glucose, Bld: 85 mg/dL (ref 70–99)
Potassium: 3.2 mmol/L — ABNORMAL LOW (ref 3.5–5.1)
Sodium: 138 mmol/L (ref 135–145)

## 2021-12-03 MED ORDER — LIDOCAINE VISCOUS HCL 2 % MT SOLN
15.0000 mL | Freq: Once | OROMUCOSAL | Status: AC
  Administered 2021-12-03: 15 mL via OROMUCOSAL
  Filled 2021-12-03: qty 15

## 2021-12-03 MED ORDER — KETOROLAC TROMETHAMINE 30 MG/ML IJ SOLN
15.0000 mg | Freq: Once | INTRAMUSCULAR | Status: AC
  Administered 2021-12-03: 15 mg via INTRAVENOUS
  Filled 2021-12-03: qty 1

## 2021-12-03 MED ORDER — DEXAMETHASONE SODIUM PHOSPHATE 10 MG/ML IJ SOLN
10.0000 mg | Freq: Once | INTRAMUSCULAR | Status: AC
  Administered 2021-12-03: 10 mg via INTRAVENOUS
  Filled 2021-12-03: qty 1

## 2021-12-03 MED ORDER — SODIUM CHLORIDE 0.9 % IV SOLN: INTRAVENOUS | Status: DC | PRN

## 2021-12-03 MED ORDER — SODIUM CHLORIDE 0.9 % IV SOLN
3.0000 g | Freq: Once | INTRAVENOUS | Status: AC
  Administered 2021-12-03: 3 g via INTRAVENOUS

## 2021-12-03 MED ORDER — DEXAMETHASONE SODIUM PHOSPHATE 10 MG/ML IJ SOLN
8.0000 mg | Freq: Three times a day (TID) | INTRAMUSCULAR | Status: AC
  Administered 2021-12-04 (×3): 8 mg via INTRAVENOUS
  Filled 2021-12-03: qty 0.8
  Filled 2021-12-03: qty 1
  Filled 2021-12-03 (×2): qty 0.8

## 2021-12-03 MED ORDER — IOHEXOL 300 MG/ML  SOLN
75.0000 mL | Freq: Once | INTRAMUSCULAR | Status: AC | PRN
  Administered 2021-12-03: 75 mL via INTRAVENOUS

## 2021-12-03 NOTE — ED Provider Notes (Signed)
  [facility] EMERGENCY DEPARTMENT Provider Note   CSN: [id number] Arrival date & time: 12/03/21  1845     History {Add pertinent medical, surgical, social history, OB history to HPI:1} Chief Complaint  Patient presents with  . Sore Throat    Dawn Gould is a 31 y.o. female.   Sore Throat      Home Medications Prior to Admission medications   Medication Sig Start Date End Date Taking? Authorizing Provider  doxycycline (VIBRAMYCIN) 100 MG capsule Take 1 capsule (100 mg total) by mouth 2 (two) times daily. 08/02/21   Achille Rich, PA-C  naproxen (NAPROSYN) 500 MG tablet Take 1 tablet (500 mg total) by mouth 2 (two) times daily. 10/10/21   Vanetta Mulders, MD  oxyCODONE-acetaminophen (PERCOCET/ROXICET) 5-325 MG tablet Take 1 tablet by mouth every 6 (six) hours as needed for severe pain. 08/02/21   Achille Rich, PA-C      Allergies    Tramadol    Review of Systems   Review of Systems  Physical Exam Updated Vital Signs BP 122/78 (BP Location: Left Arm)   Pulse 90   Temp 99.2 F (37.3 C) (Oral)   Resp 18   Ht 5\' 3"  (1.6 m)   Wt 83.9 kg   LMP 12/01/2021   SpO2 96%   BMI 32.77 kg/m  Physical Exam  ED Results / Procedures / Treatments   Labs (all labs ordered are listed, but only abnormal results are displayed) Labs Reviewed - No data to display  EKG None  Radiology No results found.  Procedures Procedures  {Document cardiac monitor, telemetry assessment procedure when appropriate:1}  Medications Ordered in ED Medications - No data to display  ED Course/ Medical Decision Making/ A&P                           Medical Decision Making  ***  {Document critical care time when appropriate:1} {Document review of labs and clinical decision tools ie heart score, Chads2Vasc2 etc:1}  {Document your independent review of radiology images, and any outside records:1} {Document your discussion with family members, caretakers, and with  consultants:1} {Document social determinants of health affecting pt's care:1} {Document your decision making why or why not admission, treatments were needed:1} Final Clinical Impression(s) / ED Diagnoses Final diagnoses:  None    Rx / DC Orders ED Discharge Orders     None

## 2021-12-03 NOTE — ED Triage Notes (Signed)
Pt arrives pov, steady gait, c/o difficulty swallowing secretions. Tx for same yesterday. Also endorses neck tenderness and swelling

## 2021-12-04 ENCOUNTER — Encounter (HOSPITAL_COMMUNITY): Payer: Self-pay | Admitting: Internal Medicine

## 2021-12-04 DIAGNOSIS — J45909 Unspecified asthma, uncomplicated: Secondary | ICD-10-CM | POA: Diagnosis present

## 2021-12-04 DIAGNOSIS — F17290 Nicotine dependence, other tobacco product, uncomplicated: Secondary | ICD-10-CM | POA: Diagnosis present

## 2021-12-04 DIAGNOSIS — J039 Acute tonsillitis, unspecified: Secondary | ICD-10-CM | POA: Diagnosis present

## 2021-12-04 DIAGNOSIS — J36 Peritonsillar abscess: Secondary | ICD-10-CM | POA: Diagnosis present

## 2021-12-04 DIAGNOSIS — Z8249 Family history of ischemic heart disease and other diseases of the circulatory system: Secondary | ICD-10-CM | POA: Diagnosis not present

## 2021-12-04 DIAGNOSIS — Z79899 Other long term (current) drug therapy: Secondary | ICD-10-CM | POA: Diagnosis not present

## 2021-12-04 DIAGNOSIS — Z833 Family history of diabetes mellitus: Secondary | ICD-10-CM | POA: Diagnosis not present

## 2021-12-04 LAB — HIV ANTIBODY (ROUTINE TESTING W REFLEX): HIV Screen 4th Generation wRfx: NONREACTIVE

## 2021-12-04 MED ORDER — ONDANSETRON HCL 4 MG/2ML IJ SOLN: 4.0000 mg | Freq: Four times a day (QID) | INTRAMUSCULAR | Status: DC | PRN

## 2021-12-04 MED ORDER — SODIUM CHLORIDE 0.9 % IV SOLN
3.0000 g | Freq: Four times a day (QID) | INTRAVENOUS | Status: AC
  Administered 2021-12-04 (×3): 3 g via INTRAVENOUS
  Filled 2021-12-04 (×3): qty 8

## 2021-12-04 MED ORDER — CLINDAMYCIN HCL 300 MG PO CAPS
300.0000 mg | ORAL_CAPSULE | Freq: Three times a day (TID) | ORAL | Status: DC
Start: 2021-12-05 — End: 2021-12-05
  Administered 2021-12-05: 300 mg via ORAL
  Filled 2021-12-04: qty 1

## 2021-12-04 MED ORDER — HYDRALAZINE HCL 20 MG/ML IJ SOLN: 10.0000 mg | INTRAMUSCULAR | Status: DC | PRN

## 2021-12-04 MED ORDER — ACETAMINOPHEN 650 MG RE SUPP: 650.0000 mg | Freq: Four times a day (QID) | RECTAL | Status: DC | PRN

## 2021-12-04 MED ORDER — ACETAMINOPHEN 325 MG PO TABS: 650.0000 mg | ORAL_TABLET | Freq: Four times a day (QID) | ORAL | Status: DC | PRN

## 2021-12-04 MED ORDER — LACTATED RINGERS IV SOLN: INTRAVENOUS | Status: DC

## 2021-12-04 MED ORDER — OXYCODONE HCL 5 MG PO TABS
5.0000 mg | ORAL_TABLET | ORAL | Status: DC | PRN
  Administered 2021-12-05: 10 mg via ORAL
  Filled 2021-12-04: qty 2

## 2021-12-04 MED ORDER — ONDANSETRON 4 MG PO TBDP: 4.0000 mg | ORAL_TABLET | Freq: Four times a day (QID) | ORAL | Status: DC | PRN

## 2021-12-04 MED ORDER — MORPHINE SULFATE (PF) 2 MG/ML IV SOLN
2.0000 mg | INTRAVENOUS | Status: DC | PRN
  Administered 2021-12-04 (×2): 2 mg via INTRAVENOUS
  Filled 2021-12-04 (×2): qty 1

## 2021-12-04 NOTE — Progress Notes (Signed)
ENT cart obtained from OR, at bedside.

## 2021-12-04 NOTE — Progress Notes (Signed)
Pharmacy Antibiotic Note  Dawn Gould is a 31 y.o. female admitted on 12/03/2021 with  tonsillitis with abscess(es) .  Pharmacy has been consulted for Unasyn dosing.  Plan: Unasyn 3 g IV q6h Follow-up clinical status, renal function, LOT, de-escalate as able  Height: 5\' 3"  (160 cm) Weight: 83.9 kg (185 lb) IBW/kg (Calculated) : 52.4  Temp (24hrs), Avg:98.4 F (36.9 C), Min:97.9 F (36.6 C), Max:99.2 F (37.3 C)  Recent Labs  Lab 12/03/21 1941  WBC 13.7*  CREATININE 0.86    Estimated Creatinine Clearance: 99 mL/min (by C-G formula based on SCr of 0.86 mg/dL).    Allergies  Allergen Reactions   Ultram [Tramadol] Palpitations    Hot flash   Latex Swelling    Antimicrobials this admission: Unasyn 7/1 >>    Thank you for allowing pharmacy to be a part of this patient's care.  9/1, PharmD PGY-2 Pharmacy Resident Phone [phone] 12/04/2021 11:28 AM   Please check AMION for all [facility] Pharmacy phone numbers After 10:00 PM, call Main Pharmacy [phone]

## 2021-12-04 NOTE — Consult Note (Signed)
ENT CONSULT:  Reason for Consult: Peritonsillar abscess  Referring Physician:  Dr. Dalene Seltzer  HPI: Dawn Gould is an 31 y.o. female who presented to [facility] ED yesterday with complaints of worsening sore throat and dysphagia.  Patient had been seen in ED 2 days prior with similar symptoms and was diagnosed with strep throat.  She was discharged with oral antibiotics, but states that her symptoms continue to progress despite antibiotic therapy.  A CT neck with contrast was performed which demonstrated bilateral peritonsillar abscesses, right greater than left.  Patient had significant dysphagia, odynophagia even despite IV Decadron, so decision was made to admit patient to [facility] for further antibiotic therapy and incision and drainage.  On exam this morning, patient notes improvement in her symptoms, but with continued primarily right-sided throat pain.  She denies history of previous peritonsillar abscess or history of recurrent tonsillitis.  Past Medical History:  Diagnosis Date   Asthma    Back pain    Hidradenitis suppurativa    takes periodic antibiotics    Past Surgical History:  Procedure Laterality Date   CESAREAN SECTION N/A 01/25/2019   Procedure: CESAREAN SECTION;  Surgeon: Conan Bowens, MD;  Location: [facility] LD ORS;  Service: Obstetrics;  Laterality: N/A;   TUBAL LIGATION      Family History  Problem Relation Age of Onset   Hypertension Mother    Diabetes Paternal Uncle     Social History:  reports that she has been smoking cigars. She has never used smokeless tobacco. She reports current alcohol use. She reports that she does not use drugs.  Allergies:  Allergies  Allergen Reactions   Ultram [Tramadol] Palpitations    Hot flash   Latex Swelling    Medications: I have reviewed the patient's current medications.  Results for orders placed or performed during the hospital encounter of 12/03/21 (from the past 48 hour(s))  Basic metabolic panel     Status:  Abnormal   Collection Time: 12/03/21  7:41 PM  Result Value Ref Range   Sodium 138 135 - 145 mmol/L   Potassium 3.2 (L) 3.5 - 5.1 mmol/L   Chloride 107 98 - 111 mmol/L   CO2 24 22 - 32 mmol/L   Glucose, Bld 85 70 - 99 mg/dL    Comment: Glucose reference range applies only to samples taken after fasting for at least 8 hours.   BUN 15 6 - 20 mg/dL   Creatinine, Ser 1.60 0.44 - 1.00 mg/dL   Calcium 8.5 (L) 8.9 - 10.3 mg/dL   GFR, Estimated >73 >71 mL/min    Comment: (NOTE) Calculated using the CKD-EPI Creatinine Equation (2021)    Anion gap 7 5 - 15    Comment: Performed at [facility], [address] [place], [place] [address]  CBC with Differential     Status: Abnormal   Collection Time: 12/03/21  7:41 PM  Result Value Ref Range   WBC 13.7 (H) 4.0 - 10.5 K/uL   RBC 4.57 3.87 - 5.11 MIL/uL   Hemoglobin 12.5 12.0 - 15.0 g/dL   HCT 48.5 46.2 - 70.3 %   MCV 83.2 80.0 - 100.0 fL   MCH 27.4 26.0 - 34.0 pg   MCHC 32.9 30.0 - 36.0 g/dL   RDW 50.0 93.8 - 18.2 %   Platelets 247 150 - 400 K/uL   nRBC 0.0 0.0 - 0.2 %   Neutrophils Relative % 74 %   Neutro Abs 10.2 (H) 1.7 -  7.7 K/uL   Lymphocytes Relative 18 %   Lymphs Abs 2.4 0.7 - 4.0 K/uL   Monocytes Relative 7 %   Monocytes Absolute 0.9 0.1 - 1.0 K/uL   Eosinophils Relative 0 %   Eosinophils Absolute 0.0 0.0 - 0.5 K/uL   Basophils Relative 0 %   Basophils Absolute 0.0 0.0 - 0.1 K/uL   Immature Granulocytes 1 %   Abs Immature Granulocytes 0.08 (H) 0.00 - 0.07 K/uL    Comment: Performed at [facility], [address] [place], [place] [address]  Pregnancy, urine     Status: None   Collection Time: 12/03/21  7:50 PM  Result Value Ref Range   Preg Test, Ur NEGATIVE NEGATIVE    Comment:        THE SENSITIVITY OF THIS METHODOLOGY IS >20 mIU/mL. Performed at [facility], [address], [place], [place] [address]     CT Soft Tissue Neck W Contrast  Result Date: 12/03/2021 CLINICAL DATA:   Initial evaluation for acute epiglottitis or tonsillitis. EXAM: CT NECK WITH CONTRAST TECHNIQUE: Multidetector CT imaging of the neck was performed using the standard protocol following the bolus administration of intravenous contrast. RADIATION DOSE REDUCTION: This exam was performed according to the departmental dose-optimization program which includes automated exposure control, adjustment of the mA and/or kV according to patient size and/or use of iterative reconstruction technique. CONTRAST:  39mL OMNIPAQUE IOHEXOL 300 MG/ML  SOLN COMPARISON:  None available. FINDINGS: Pharynx and larynx: Oral cavity within normal limits. No acute inflammatory changes seen about the dentition. Palatine tonsils are enlarged and hyperenhancing bilaterally, consistent with acute tonsillitis, slightly worse on the right. Superimposed hypodense collection at the lateral margin of the right tonsil measures 1.7 x 1.1 x 2.2 cm, consistent with tonsillar/peritonsillar abscess. A smaller tonsillar/peritonsillar abscess at the lateral margin of the left tonsil measures 1.0 x 1.0 x 1.6 cm (series 3, image 36). Hazy inflammatory stranding within the adjacent right greater than left parapharyngeal fat. Remainder of the oropharynx and nasopharynx otherwise unremarkable. No retropharyngeal collection or swelling. Epiglottis normal. Hypopharynx and supraglottic larynx within normal limits. Glottis normal. Subglottic airway patent clear. Salivary glands: Salivary glands including the parotid and submandibular glands are within normal limits. Thyroid: Normal. Lymph nodes: Mildly prominent level 2 lymph nodes measure up to 1.6 cm in short axis, likely reactive. No other enlarged or pathologic adenopathy within the neck. Vascular: Normal intravascular enhancement seen throughout the neck. Limited intracranial: Unremarkable. Visualized orbits: Unremarkable. Mastoids and visualized paranasal sinuses: Visualized paranasal sinuses are clear. Visualized  mastoids and middle ear cavities are well pneumatized and free of fluid. Skeleton: No discrete or worrisome osseous lesions. Upper chest: Unremarkable. Other: None. IMPRESSION: 1. Findings consistent with acute tonsillitis with superimposed bilateral tonsillar/peritonsillar abscesses as above, measuring up to 2.2 cm on the right and 1.6 cm on the left. 2. Mildly prominent level 2 lymph nodes, likely reactive. Electronically Signed   By: Rise Mu M.D.   On: 12/03/2021 21:13    ROS:ROS  Blood pressure (!) 127/93, pulse 76, temperature 98.2 F (36.8 C), temperature source Oral, resp. rate 18, height 5\' 3"  (1.6 m), weight 83.9 kg, last menstrual period 12/01/2021, SpO2 100 %, currently breastfeeding.  PHYSICAL EXAM: CONSTITUTIONAL: well developed, nourished, no distress and alert and oriented x 3 PULMONARY/CHEST WALL: effort normal and no stridor, no stertor, muffled voice noted. HENT: Head : normocephalic and atraumatic Ears: Right ear:   canal normal, external ear normal  and hearing normal Left ear:   canal normal, external ear normal and hearing normal Nose: nose normal and no purulence Mouth/Throat:  Mouth: Tonsillar hypertrophy noted bilaterally, right greater than left.  Palatal edema noted on right, no palatal edema appreciated on left.  Uvula slightly deviated to the left. Mucous membranes: normal EYES: conjunctiva normal, EOM normal and PERRL NECK: supple, trachea normal and no thyromegaly.  Reactive bilateral cervical adenopathy palpated.  Studies Reviewed:CT Neck with contrast personally reviewed, bilateral peritonsillar abscesses noted, right greater than left.  Left approximately 1 cm in maximal dimension without significant ring enhancement, may represent phlegmon.  Incision and Drainage Procedure Note  Pre-procedural Diagnosis: Right peritonsillar abscess  Post-procedural Diagnosis: same  Anesthesia: 1% lidocaine with epinephrine  Procedure Details  The procedure,  risks and complications have been discussed in detail (including, but not limited to airway compromise, infection, bleeding) with the patient, and the patient has given verbal consent to the procedure.  After adequate local anesthesia, I&D with a #11 and 15 blade was performed on the right peritonsillar abscess.  A tonsil clamp was used to break up further loculations in the abscess cavity.  Copious purulent drainage was suctioned from the oropharynx.  Condition: Tolerated procedure well  Complications: None   Assessment/Plan: Dawn Gould is a 31 y/o F with recent history of acute tonsillitis presenting for further management of peritonsillar abscess.  Recent CT scan demonstrated a definitive greater than 2 cm right peritonsillar abscess, with subcentimeter likely peritonsillar phlegmon on the left.  Incision and drainage performed of the right peritonsillar abscess, with copious purulent drainage suctioned free from oropharync.  Patient noted immediate improvement in her symptoms following procedure.  Based on exam, I do not think the left abscess/phlegmon requires drainage, and suspect it will completely resolve with continued antibiotic therapy. -Recommend completion of 8 mg of IV Decadron every 8 hours for 3 total doses, 1 dose remaining -Can likely transition to oral antibiotics.  Recommend discharge on clindamycin, 300 mg 3 times daily for 10 days. -Okay to resume diet, can advance as tolerated -Stable for discharge after last dose of Decadron from ENT perspective -Follow-up with ENT outpatient in 1 week, or sooner if needed.   Thank you for allowing me to participate in the care of this patient. Please do not hesitate to contact me with any questions or concerns.   Laren Boom, DO Otolaryngology [facility] ENT Cell: [phone]   12/04/2021, 12:59 PM

## 2021-12-04 NOTE — ED Notes (Signed)
Report given to [facility] RN with [facility]

## 2021-12-04 NOTE — H&P (Signed)
History and Physical    Patient: Dawn Gould GBT:[id number] DOB: 25-Jul-1990 DOA: 12/03/2021 DOS: the patient was seen and examined on 12/04/2021 PCP: Maryanna Shape, MD  Patient coming from: Home - lives with children; NOK: Mother, Leda Quail, [phone]   Chief Complaint: Sore throat  HPI: Dawn Gould is a 31 y.o. female with medical history significant of asthma presenting with sore throat.  She was seen for the same on 6/30 and was diagnosed with clinical strep pharyngitis with "no concern for deep space abscess or infection"; she was treated with Decadron and IM PCN -> Doxy and was GAS negative. This is day 5 of symptoms.  Thursday, she had a sore throat, able to eat/drink.  She awoke Friday AM and her mouth was dry, couldn't swallow and it was painful.  She looked at [facility] and went to the ER due to swelling, exudates.  She was given a shot of abx and steroids and was told to return if not getting better.  It felt like it was closing in, she was having trouble breathing, unable to swallow, terrible pain so she came back.   She has never had strep before.  No sick contacts including her kids.    ER Course:  [facility] to [facility] transfer, per Dr. Antionette Char:  [facility] transfer for acute tonisillitis with b/l peritonsillar abscesses. She is a 31 yr old with asthma who presented to [facility] on 6/30 with 3 days of sore throat and pain with swallowing, had negative GAS, and was treated with penicillin and Decadron. She returns with increased swelling, pain, and difficulty swallowing and has CT concerning for b/l peritonsillar abscesses. Airway patent on CT and she is managing secretions. ENT [facility]) recommended admission to [facility] for IV abx and they will see her in the am. She was given Unasyn and Decadron.      Review of Systems: As mentioned in the history of present illness. All other systems reviewed and are negative. Past Medical History:  Diagnosis Date   Asthma    Back pain     Hidradenitis suppurativa    takes periodic antibiotics   Past Surgical History:  Procedure Laterality Date   CESAREAN SECTION N/A 01/25/2019   Procedure: CESAREAN SECTION;  Surgeon: Conan Bowens, MD;  Location: [facility] LD ORS;  Service: Obstetrics;  Laterality: N/A;   TUBAL LIGATION     Social History:  reports that she has been smoking cigars. She has never used smokeless tobacco. She reports current alcohol use. She reports that she does not use drugs.  Allergies  Allergen Reactions   Ultram [Tramadol] Palpitations    Hot flash   Latex Swelling    Family History  Problem Relation Age of Onset   Hypertension Mother    Diabetes Paternal Uncle     Prior to Admission medications   Medication Sig Start Date End Date Taking? Authorizing Provider  doxycycline (VIBRAMYCIN) 100 MG capsule Take 1 capsule (100 mg total) by mouth 2 (two) times daily. 08/02/21   Achille Rich, PA-C  naproxen (NAPROSYN) 500 MG tablet Take 1 tablet (500 mg total) by mouth 2 (two) times daily. 10/10/21   Vanetta Mulders, MD  oxyCODONE-acetaminophen (PERCOCET/ROXICET) 5-325 MG tablet Take 1 tablet by mouth every 6 (six) hours as needed for severe pain. 08/02/21   Achille Rich, PA-C    Physical Exam: Vitals:   12/04/21 0700 12/04/21 0851 12/04/21 0958 12/04/21 1427  BP: 115/80 (!) 117/96 (!) 127/93 106/70  Pulse: (!) 56 60  76 (!) 53  Resp: 16 18 18 18   Temp:  97.9 F (36.6 C) 98.2 F (36.8 C) 98.7 F (37.1 C)  TempSrc:  Oral Oral Oral  SpO2: 98% 100% 100% 100%  Weight:      Height:       General:  Appears calm and comfortable and is in NAD, mild "hot potato voice" Eyes:   EOMI, normal lids, iris ENT:  grossly normal hearing, lips & tongue, mmm; appropriate dentition; large "kissing" tonsils with minimal exudate, +erythema Neck:  mild submandibular LAD, no masses or thyromegaly Cardiovascular:  RRR, no m/r/g. No LE edema.  Respiratory:   CTA bilaterally with no wheezes/rales/rhonchi.  Normal respiratory  effort. Abdomen:  soft, NT, ND Skin:  no rash or induration seen on limited exam Musculoskeletal:  grossly normal tone BUE/BLE, good ROM, no bony abnormality Psychiatric:  grossly normal mood and affect, speech fluent and appropriate, AOx3 Neurologic:  CN 2-12 grossly intact, moves all extremities in coordinated fashion   Radiological Exams on Admission: Independently reviewed - see discussion in A/P where applicable  CT Soft Tissue Neck W Contrast  Result Date: 12/03/2021 CLINICAL DATA:  Initial evaluation for acute epiglottitis or tonsillitis. EXAM: CT NECK WITH CONTRAST TECHNIQUE: Multidetector CT imaging of the neck was performed using the standard protocol following the bolus administration of intravenous contrast. RADIATION DOSE REDUCTION: This exam was performed according to the departmental dose-optimization program which includes automated exposure control, adjustment of the mA and/or kV according to patient size and/or use of iterative reconstruction technique. CONTRAST:  28mL OMNIPAQUE IOHEXOL 300 MG/ML  SOLN COMPARISON:  None available. FINDINGS: Pharynx and larynx: Oral cavity within normal limits. No acute inflammatory changes seen about the dentition. Palatine tonsils are enlarged and hyperenhancing bilaterally, consistent with acute tonsillitis, slightly worse on the right. Superimposed hypodense collection at the lateral margin of the right tonsil measures 1.7 x 1.1 x 2.2 cm, consistent with tonsillar/peritonsillar abscess. A smaller tonsillar/peritonsillar abscess at the lateral margin of the left tonsil measures 1.0 x 1.0 x 1.6 cm (series 3, image 36). Hazy inflammatory stranding within the adjacent right greater than left parapharyngeal fat. Remainder of the oropharynx and nasopharynx otherwise unremarkable. No retropharyngeal collection or swelling. Epiglottis normal. Hypopharynx and supraglottic larynx within normal limits. Glottis normal. Subglottic airway patent clear. Salivary  glands: Salivary glands including the parotid and submandibular glands are within normal limits. Thyroid: Normal. Lymph nodes: Mildly prominent level 2 lymph nodes measure up to 1.6 cm in short axis, likely reactive. No other enlarged or pathologic adenopathy within the neck. Vascular: Normal intravascular enhancement seen throughout the neck. Limited intracranial: Unremarkable. Visualized orbits: Unremarkable. Mastoids and visualized paranasal sinuses: Visualized paranasal sinuses are clear. Visualized mastoids and middle ear cavities are well pneumatized and free of fluid. Skeleton: No discrete or worrisome osseous lesions. Upper chest: Unremarkable. Other: None. IMPRESSION: 1. Findings consistent with acute tonsillitis with superimposed bilateral tonsillar/peritonsillar abscesses as above, measuring up to 2.2 cm on the right and 1.6 cm on the left. 2. Mildly prominent level 2 lymph nodes, likely reactive. Electronically Signed   By: 72m M.D.   On: 12/03/2021 21:13    EKG: not done   Labs on Admission: I have personally reviewed the available labs and imaging studies at the time of the admission.  Pertinent labs:    K+ 3.2 WBC 13.7 Pregnancy negative   Assessment and Plan: Principal Problem:   Peritonsillar abscess Active Problems:   Acute tonsillitis    Tonsillitis  with peritonsillar abscess -Patient with acute onset of throat discomfort Thursday -She was seen at [facility] and treated with Bicillin -> doxy and Decadron -She worsened and so returned to [facility] -Imaging showed R > L peritonsillar abscesses -She was transferred to [facility] for ENT evaluation -She was seen by Dr. Marene Lenz and had bedside drainage of R abscess with immediate improvement in symptoms -She is recommended to transition to PO Clindamycin (would start tomorrow) 300 mg TID x 10 days -She also needs to complete Decadron 8 mg q8h x 3 total doses -Will resume diet with clears and advance as tolerated -Likely  ok for dc to home tomorrow if still improving -Needs outpatient ENT f/u in 1 week     Advance Care Planning:   Code Status: Full Code   Consults: ENT  DVT Prophylaxis: SCDs  Family Communication: None present; she is capable of communicating with family at this time  Severity of Illness: The appropriate patient status for this patient is INPATIENT. Inpatient status is judged to be reasonable and necessary in order to provide the required intensity of service to ensure the patient's safety. The patient's presenting symptoms, physical exam findings, and initial radiographic and laboratory data in the context of their chronic comorbidities is felt to place them at high risk for further clinical deterioration. Furthermore, it is not anticipated that the patient will be medically stable for discharge from the hospital within 2 midnights of admission.   * I certify that at the point of admission it is my clinical judgment that the patient will require inpatient hospital care spanning beyond 2 midnights from the point of admission due to high intensity of service, high risk for further deterioration and high frequency of surveillance required.*  Author: Jonah Blue, MD 12/04/2021 5:11 PM  For on call review [email or web address].

## 2021-12-05 DIAGNOSIS — J039 Acute tonsillitis, unspecified: Secondary | ICD-10-CM | POA: Diagnosis not present

## 2021-12-05 DIAGNOSIS — J36 Peritonsillar abscess: Secondary | ICD-10-CM | POA: Diagnosis not present

## 2021-12-05 LAB — BASIC METABOLIC PANEL
Anion gap: 7 (ref 5–15)
BUN: 13 mg/dL (ref 6–20)
CO2: 22 mmol/L (ref 22–32)
Calcium: 8.2 mg/dL — ABNORMAL LOW (ref 8.9–10.3)
Chloride: 108 mmol/L (ref 98–111)
Creatinine, Ser: 0.66 mg/dL (ref 0.44–1.00)
GFR, Estimated: >60 mL/min (ref >60)
Glucose, Bld: 134 mg/dL — ABNORMAL HIGH (ref 70–99)
Potassium: 3.9 mmol/L (ref 3.5–5.1)
Sodium: 137 mmol/L (ref 135–145)

## 2021-12-05 LAB — CBC
HCT: 35.6 % — ABNORMAL LOW (ref 36.0–46.0)
Hemoglobin: 11.5 g/dL — ABNORMAL LOW (ref 12.0–15.0)
MCH: 27.2 pg (ref 26.0–34.0)
MCHC: 32.3 g/dL (ref 30.0–36.0)
MCV: 84.2 fL (ref 80.0–100.0)
Platelets: 221 10*3/uL (ref 150–400)
RBC: 4.23 MIL/uL (ref 3.87–5.11)
RDW: 13.7 % (ref 11.5–15.5)
WBC: 18.8 10*3/uL — ABNORMAL HIGH (ref 4.0–10.5)
nRBC: 0 % (ref 0.0–0.2)

## 2021-12-05 MED ORDER — IBUPROFEN 600 MG PO TABS: 600.0000 mg | ORAL_TABLET | Freq: Three times a day (TID) | ORAL | 1 refills | Status: AC | PRN

## 2021-12-05 MED ORDER — CLINDAMYCIN HCL 300 MG PO CAPS: 300.0000 mg | ORAL_CAPSULE | Freq: Three times a day (TID) | ORAL | 0 refills | Status: AC

## 2021-12-05 NOTE — Progress Notes (Signed)
Discharge instructions given to patient. Patient verbalized understanding of all teaching. Patient discharged to home via wheelchair with all belongings.

## 2021-12-05 NOTE — Discharge Summary (Signed)
Physician Discharge Summary   Patient: Dawn Gould MRN: [id number] DOB: June 11, 1990  Admit date:     12/03/2021  Discharge date: 12/05/21  Discharge Physician: Rebekah Chesterfield Rokhaya Quinn   PCP: Maryanna Shape, MD   Recommendations at discharge:   Follow-up with your primary care physician in 1 week.   Complete the course of antibiotic with clindamycin. Refer to ENT Dr. Marene Lenz as needed in 1 week.  Discharge Diagnoses: Principal Problem:   Peritonsillar abscess Active Problems:   Acute tonsillitis  Resolved Problems:   * No resolved hospital problems. *  Hospital Course: Dawn Gould is a 31 y.o. female with medical history significant of asthma presented to hospital with sore throat.  Of note patient was recently seen on 12/02/2021 and was diagnosed of pharyngitis and was given Decadron IM and penicillin initially but group A strep was negative.  Patient was then given doxycycline on discharge but despite completing 5 days he continued to have sore throat and was unable to swallow which was painful.  Feels like she was unable to breathe so she presented to the hospital.  In the ED, patient was noted to have acute tonsillitis with bilateral peritonsillar abscess from CT scan of the soft tissue of the neck.  Patient was then admitted to hospital for further evaluation and treatment by ENT.  Following conditions were addressed during hospitalization,  Tonsillitis with peritonsillar abscess Patient treatment with doxycycline.  CT scan showing bilateral peritonsillar abscesses.  ENT has seen the patient and patient underwent bedside drainage of right tonsillar abscess with immediate improvement in symptoms.  ENT has recommended transition to PO Clindamycin  300 mg TID x 10 days on discharge and has completed 3 doses of Decadron.  Patient will need to follow-up with primary care physician as outpatient.  Recommend follow-up with ENT as outpatient.  Consultants: ENT  Procedures performed:  Status post I&D of tonsillar abscess  Disposition: Home  Diet recommendation:  Discharge Diet Orders (From admission, onward)     Start     Ordered   12/05/21 0000  Diet general        12/05/21 0828           Regular diet DISCHARGE MEDICATION: Allergies as of 12/05/2021       Reactions   Ultram [tramadol] Palpitations   Hot flash   Latex Swelling        Medication List     TAKE these medications    clindamycin 300 MG capsule Commonly known as: CLEOCIN Take 1 capsule (300 mg total) by mouth every 8 (eight) hours for 10 days.   ibuprofen 600 MG tablet Commonly known as: ADVIL Take 1 tablet (600 mg total) by mouth every 8 (eight) hours as needed for moderate pain.   OMEGA-3 PO Take 1 capsule by mouth daily.        Follow-up Information     primary care provider Follow up in 1 week(s).                 Subjective. Today, patient was seen and examined at bedside.  Complains of feeling much better in the throat.  Wanting to go home today.  Discharge Exam: Filed Weights   12/03/21 1854  Weight: 83.9 kg      12/05/2021    8:25 AM 12/05/2021    4:55 AM 12/04/2021    2:27 PM  Vitals with BMI  Systolic 135 126 601  Diastolic 85 82 70  Pulse 54 54 53  General:  Average built, not in obvious distress HENT:   No scleral pallor or icterus noted. Oral mucosa is moist.  Mild pharyngeal erythema noted. Chest:  Clear breath sounds.  Diminished breath sounds bilaterally. No crackles or wheezes.  CVS: S1 &S2 heard. No murmur.  Regular rate and rhythm. Abdomen: Soft, nontender, nondistended.  Bowel sounds are heard.   Extremities: No cyanosis, clubbing or edema.  Peripheral pulses are palpable. Psych: Alert, awake and oriented, normal mood CNS:  No cranial nerve deficits.  Power equal in all extremities.   Skin: Warm and dry.  No rashes noted.  Condition at discharge: good  The results of significant diagnostics from this hospitalization (including imaging,  microbiology, ancillary and laboratory) are listed below for reference.   Imaging Studies: CT Soft Tissue Neck W Contrast  Result Date: 12/03/2021 CLINICAL DATA:  Initial evaluation for acute epiglottitis or tonsillitis. EXAM: CT NECK WITH CONTRAST TECHNIQUE: Multidetector CT imaging of the neck was performed using the standard protocol following the bolus administration of intravenous contrast. RADIATION DOSE REDUCTION: This exam was performed according to the departmental dose-optimization program which includes automated exposure control, adjustment of the mA and/or kV according to patient size and/or use of iterative reconstruction technique. CONTRAST:  75mL OMNIPAQUE IOHEXOL 300 MG/ML  SOLN COMPARISON:  None available. FINDINGS: Pharynx and larynx: Oral cavity within normal limits. No acute inflammatory changes seen about the dentition. Palatine tonsils are enlarged and hyperenhancing bilaterally, consistent with acute tonsillitis, slightly worse on the right. Superimposed hypodense collection at the lateral margin of the right tonsil measures 1.7 x 1.1 x 2.2 cm, consistent with tonsillar/peritonsillar abscess. A smaller tonsillar/peritonsillar abscess at the lateral margin of the left tonsil measures 1.0 x 1.0 x 1.6 cm (series 3, image 36). Hazy inflammatory stranding within the adjacent right greater than left parapharyngeal fat. Remainder of the oropharynx and nasopharynx otherwise unremarkable. No retropharyngeal collection or swelling. Epiglottis normal. Hypopharynx and supraglottic larynx within normal limits. Glottis normal. Subglottic airway patent clear. Salivary glands: Salivary glands including the parotid and submandibular glands are within normal limits. Thyroid: Normal. Lymph nodes: Mildly prominent level 2 lymph nodes measure up to 1.6 cm in short axis, likely reactive. No other enlarged or pathologic adenopathy within the neck. Vascular: Normal intravascular enhancement seen throughout the  neck. Limited intracranial: Unremarkable. Visualized orbits: Unremarkable. Mastoids and visualized paranasal sinuses: Visualized paranasal sinuses are clear. Visualized mastoids and middle ear cavities are well pneumatized and free of fluid. Skeleton: No discrete or worrisome osseous lesions. Upper chest: Unremarkable. Other: None. IMPRESSION: 1. Findings consistent with acute tonsillitis with superimposed bilateral tonsillar/peritonsillar abscesses as above, measuring up to 2.2 cm on the right and 1.6 cm on the left. 2. Mildly prominent level 2 lymph nodes, likely reactive. Electronically Signed   By: Rise Mu M.D.   On: 12/03/2021 21:13    Microbiology: Results for orders placed or performed during the hospital encounter of 12/02/21  Group A Strep by PCR     Status: None   Collection Time: 12/02/21  7:57 AM   Specimen: Throat; Sterile Swab  Result Value Ref Range Status   Group A Strep by PCR NOT DETECTED NOT DETECTED Final    Comment: Performed at [facility], [address] [place], [place] [address]  SARS Coronavirus 2 by RT PCR (hospital order, performed in [facility] hospital lab) *cepheid single result test* Throat     Status: None   Collection Time: 12/02/21  7:57 AM  Specimen: Throat; Nasal Swab  Result Value Ref Range Status   SARS Coronavirus 2 by RT PCR NEGATIVE NEGATIVE Final    Comment: (NOTE) SARS-CoV-2 target nucleic acids are NOT DETECTED.  The SARS-CoV-2 RNA is generally detectable in upper and lower respiratory specimens during the acute phase of infection. The lowest concentration of SARS-CoV-2 viral copies this assay can detect is 250 copies / mL. A negative result does not preclude SARS-CoV-2 infection and should not be used as the sole basis for treatment or other patient management decisions.  A negative result may occur with improper specimen collection / handling, submission of specimen other than nasopharyngeal swab, presence of viral  mutation(s) within the areas targeted by this assay, and inadequate number of viral copies (<250 copies / mL). A negative result must be combined with clinical observations, patient history, and epidemiological information.  Fact Sheet for Patients:   RoadLapTop.co.za  Fact Sheet for Healthcare Providers: http://kim-miller.com/  This test is not yet approved or  cleared by the [place] FDA and has been authorized for detection and/or diagnosis of SARS-CoV-2 by FDA under an Emergency Use Authorization (EUA).  This EUA will remain in effect (meaning this test can be used) for the duration of the COVID-19 declaration under Section 564(b)(1) of the Act, 21 U.S.C. section 360bbb-3(b)(1), unless the authorization is terminated or revoked sooner.  Performed at [facility], [address] [place], [place] [address]     Labs: CBC: Recent Labs  Lab 12/03/21 1941 12/05/21 0545  WBC 13.7* 18.8*  NEUTROABS 10.2*  --   HGB 12.5 11.5*  HCT 38.0 35.6*  MCV 83.2 84.2  PLT 247 221   Basic Metabolic Panel: Recent Labs  Lab 12/03/21 1941 12/05/21 0545  NA 138 137  K 3.2* 3.9  CL 107 108  CO2 24 22  GLUCOSE 85 134*  BUN 15 13  CREATININE 0.86 0.66  CALCIUM 8.5* 8.2*   Liver Function Tests: No results for input(s): "AST", "ALT", "ALKPHOS", "BILITOT", "PROT", "ALBUMIN" in the last 168 hours. CBG: No results for input(s): "GLUCAP" in the last 168 hours.  Discharge time spent: greater than 30 minutes.  Signed: Joycelyn Das, MD [facility] 12/05/2021

## 2021-12-05 NOTE — Hospital Course (Addendum)
Dawn Gould is a 31 y.o. female with medical history significant of asthma presented to hospital with sore throat.  Of note patient was recently seen on 12/02/2021 and was diagnosed of pharyngitis and was given Decadron IM and penicillin initially but group A strep was negative.  Patient was then given doxycycline on discharge but despite completing 5 days he continued to have sore throat and was unable to swallow which was painful.  Feels like she was unable to breathe so she presented to the hospital.  In the ED, patient was noted to have acute tonsillitis with bilateral peritonsillar abscess from CT scan of the soft tissue of the neck.  Patient was then admitted to hospital for further evaluation and treatment by ENT.  Following conditions were addressed during hospitalization,  Tonsillitis with peritonsillar abscess Patient treatment with doxycycline.  CT scan showing bilateral peritonsillar abscesses.  ENT has seen the patient and patient underwent bedside drainage of right tonsillar abscess with immediate improvement in symptoms.  ENT has recommended transition to PO Clindamycin  300 mg TID x 10 days on discharge and has completed 3 doses of Decadron.  Patient will need to follow-up with primary care physician as outpatient.  Recommend follow-up with ENT as outpatient.

## 2022-08-15 ENCOUNTER — Emergency Department (HOSPITAL_BASED_OUTPATIENT_CLINIC_OR_DEPARTMENT_OTHER)
Admission: EM | Admit: 2022-08-15 | Discharge: 2022-08-15 | Disposition: A | Discharge status: Home / Self Care | Payer: Medicaid Other | Attending: Emergency Medicine | Admitting: Emergency Medicine

## 2022-08-15 ENCOUNTER — Other Ambulatory Visit: Payer: Self-pay

## 2022-08-15 ENCOUNTER — Encounter (HOSPITAL_BASED_OUTPATIENT_CLINIC_OR_DEPARTMENT_OTHER): Payer: Self-pay

## 2022-08-15 DIAGNOSIS — Z91040 Latex allergy status: Secondary | ICD-10-CM | POA: Insufficient documentation

## 2022-08-15 DIAGNOSIS — M546 Pain in thoracic spine: Secondary | ICD-10-CM | POA: Insufficient documentation

## 2022-08-15 DIAGNOSIS — J45909 Unspecified asthma, uncomplicated: Secondary | ICD-10-CM | POA: Insufficient documentation

## 2022-08-15 DIAGNOSIS — R252 Cramp and spasm: Secondary | ICD-10-CM | POA: Diagnosis not present

## 2022-08-15 MED ORDER — NAPROXEN 500 MG PO TABS: 500.0000 mg | ORAL_TABLET | Freq: Two times a day (BID) | ORAL | 0 refills | Status: DC

## 2022-08-15 MED ORDER — NAPROXEN 500 MG PO TABS: 500.0000 mg | ORAL_TABLET | Freq: Two times a day (BID) | ORAL | 0 refills | Status: AC

## 2022-08-15 MED ORDER — LIDOCAINE 5 % EX PTCH
1.0000 | MEDICATED_PATCH | CUTANEOUS | 0 refills | Status: AC
Start: 2022-08-15 — End: ?

## 2022-08-15 MED ORDER — METHOCARBAMOL 500 MG PO TABS: 500.0000 mg | ORAL_TABLET | Freq: Four times a day (QID) | ORAL | 0 refills | Status: AC | PRN

## 2022-08-15 MED ORDER — LIDOCAINE 5 % EX PTCH: 1.0000 | MEDICATED_PATCH | CUTANEOUS | 0 refills | Status: DC

## 2022-08-15 MED ORDER — LIDOCAINE 5 % EX PTCH
1.0000 | MEDICATED_PATCH | CUTANEOUS | Status: DC
Start: 2022-08-15 — End: 2022-08-15
  Administered 2022-08-15: 1 via TRANSDERMAL
  Filled 2022-08-15: qty 1

## 2022-08-15 MED ORDER — KETOROLAC TROMETHAMINE 60 MG/2ML IM SOLN
15.0000 mg | Freq: Once | INTRAMUSCULAR | Status: AC
Start: 2022-08-15 — End: 2022-08-15
  Administered 2022-08-15: 15 mg via INTRAMUSCULAR
  Filled 2022-08-15: qty 2

## 2022-08-15 MED ORDER — METHOCARBAMOL 500 MG PO TABS: 500.0000 mg | ORAL_TABLET | Freq: Four times a day (QID) | ORAL | 0 refills | Status: DC | PRN

## 2022-08-15 NOTE — ED Triage Notes (Signed)
Pt reports she was at [facility] this morning, was not seen there but they did a chest xray.

## 2022-08-15 NOTE — ED Provider Notes (Addendum)
EMERGENCY DEPARTMENT AT [facility] Provider Note   CSN: [id number] Arrival date & time: 08/15/22  1400     History  Chief Complaint  Patient presents with   Back Pain    Dawn Gould is a 32 y.o. female.  Past medical history of asthma.  She presents the ER complaining of lower thoracic pain past 2 weeks, denies any injury or trauma.  States she works sitting at [employer].  Denies any heavy lifting, no recent cough or URI symptoms.  No fevers or chills, no numbness tingling or weakness, no saddle seizure or paresthesia, no bowel or bladder incontinence.  She had been at the [facility] ED since about 8:00 this morning and left because no provider has been to see her but she did have thoracic x-ray but did not have the results yet.  She has not tried anything for pain except for massage   No chest pain or SOB  Back Pain      Home Medications Prior to Admission medications   Medication Sig Start Date End Date Taking? Authorizing Provider  lidocaine (LIDODERM) 5 % Place 1 patch onto the skin daily. Remove & Discard patch within 12 hours or as directed by MD 08/15/22  Yes Amedeo Gory, Evart Mcdonnell A, PA-C  methocarbamol (ROBAXIN) 500 MG tablet Take 1 tablet (500 mg total) by mouth every 6 (six) hours as needed for muscle spasms. 08/15/22  Yes Gurjit Loconte A, PA-C  naproxen (NAPROSYN) 500 MG tablet Take 1 tablet (500 mg total) by mouth 2 (two) times daily. 08/15/22  Yes Catrina Fellenz A, PA-C  ibuprofen (ADVIL) 600 MG tablet Take 1 tablet (600 mg total) by mouth every 8 (eight) hours as needed for moderate pain. 12/05/21   Pokhrel, Laxman, MD  Omega-3 Fatty Acids (OMEGA-3 PO) Take 1 capsule by mouth daily.    [provider]      Allergies    Ultram [tramadol] and Latex    Review of Systems   Review of Systems  Musculoskeletal:  Positive for back pain.    Physical Exam Updated Vital Signs BP 132/86 (BP Location: Right Arm)   Pulse 76   Temp 98.7 F (37.1 C)  (Oral)   Resp 17   Ht '5\' 3"'$  (1.6 m)   Wt 85.7 kg   SpO2 100%   BMI 33.48 kg/m  Physical Exam Vitals and nursing note reviewed.  Constitutional:      General: She is not in acute distress.    Appearance: She is well-developed.  HENT:     Head: Normocephalic and atraumatic.     Mouth/Throat:     Mouth: Mucous membranes are moist.  Eyes:     Extraocular Movements: Extraocular movements intact.     Conjunctiva/sclera: Conjunctivae normal.  Cardiovascular:     Rate and Rhythm: Normal rate and regular rhythm.     Heart sounds: No murmur heard. Pulmonary:     Effort: Pulmonary effort is normal. No respiratory distress.     Breath sounds: Normal breath sounds.  Abdominal:     Palpations: Abdomen is soft.     Tenderness: There is no abdominal tenderness.  Musculoskeletal:        General: No swelling.     Cervical back: Normal and neck supple.     Thoracic back: Spasms present. Normal range of motion.     Lumbar back: Normal.     Comments: Tenderness and mild spasm noted to left paraspinous lower thoracic area  Skin:    General: Skin is warm and dry.     Capillary Refill: Capillary refill takes less than 2 seconds.  Neurological:     General: No focal deficit present.     Mental Status: She is alert and oriented to person, place, and time.     Gait: Gait normal.  Psychiatric:        Mood and Affect: Mood normal.     ED Results / Procedures / Treatments   Labs (all labs ordered are listed, but only abnormal results are displayed) Labs Reviewed - No data to display  EKG None  Radiology No results found.  Procedures Procedures    Medications Ordered in ED Medications  lidocaine (LIDODERM) 5 % 1 patch (1 patch Transdermal Patch Applied 08/15/22 1559)  ketorolac (TORADOL) injection 15 mg (15 mg Intramuscular Given 08/15/22 1556)    ED Course/ Medical Decision Making/ A&P                             Medical Decision Making This patient presents to the ED for  concern of pain x 2 weeks that is worsening, this involves an extensive number of treatment options, and is a complaint that carries with it a high risk of complications and morbidity.  The differential diagnosis includes facture, muscle spasm, HNP, abscess, contusion, other  Additional history obtained:  Additional history obtained from EMR External records from outside source obtained and reviewed including [facility] ED visit today including x-ray results  FINDINGS: Mild curvature of the lower thoracic and upper lumbar spine is convex towards the left. The vertebral body heights and the disc spaces are well preserved. No signs of acute fracture or dislocation.  IMPRESSION: 1. No acute findings. 2. Mild curvature of the lower thoracic and upper lumbar spine.    Problem List / ED Course / Critical interventions / Medication management  Thoracic back pain with no red flag symptoms going on for 2 weeks, worse with sitting still.  It is reproducible on exam.  There are no overlying skin changes to indicate there is cellulitis, abscess, zoster.  There is some palpable muscle spasm.   I ordered medication including Toradol and Lidoderm patch for pain Reevaluation of the patient after these medicines showed that the patient improved I have reviewed the patients home medicines and have made adjustments as needed    Test / Admission - Considered:  Considered further imaging but patient already had x-ray at outside facility and I have the radiology report available.  He has no signs or symptoms of cord compression or infectious signs or symptoms or other red flags to indicate need for further labs or imaging    Risk Prescription drug management.           Final Clinical Impression(s) / ED Diagnoses Final diagnoses:  Acute thoracic back pain, unspecified back pain laterality    Rx / DC Orders ED Discharge Orders          Ordered    methocarbamol (ROBAXIN) 500 MG tablet   Every 6 hours PRN        08/15/22 1559    lidocaine (LIDODERM) 5 %  Every 24 hours        08/15/22 1559    naproxen (NAPROSYN) 500 MG tablet  2 times daily        08/15/22 1559              Ladene Allocca,  Eduard Clos, PA-C 08/15/22 1601    [address] PA-C 08/15/22 1601    Elgie Congo, MD 08/16/22 [phone]

## 2022-08-15 NOTE — ED Triage Notes (Signed)
Pt c/o middle back pain that started 2 weeks ago and has gotten worse.

## 2022-08-15 NOTE — Discharge Instructions (Addendum)
Was a pleasure taking care of you today pain.  I was able to see your x-rays from [facility] that showed a mild curvature of your spine but no other acute findings.  You are being treated with medication for pain and muscle relaxers.  Avoid any heavy lifting, follow-up with your primary care doctor.  You may need referral to physical therapy if pain is not improving with medications.  Please come back to the ER for any new or worsening symptoms.

## 2022-11-14 ENCOUNTER — Emergency Department (HOSPITAL_BASED_OUTPATIENT_CLINIC_OR_DEPARTMENT_OTHER): Payer: BC Managed Care – PPO

## 2022-11-14 ENCOUNTER — Emergency Department (HOSPITAL_BASED_OUTPATIENT_CLINIC_OR_DEPARTMENT_OTHER)
Admission: EM | Admit: 2022-11-14 | Discharge: 2022-11-14 | Disposition: A | Discharge status: Home / Self Care | Payer: BC Managed Care – PPO | Attending: Emergency Medicine | Admitting: Emergency Medicine

## 2022-11-14 ENCOUNTER — Encounter (HOSPITAL_BASED_OUTPATIENT_CLINIC_OR_DEPARTMENT_OTHER): Payer: Self-pay

## 2022-11-14 DIAGNOSIS — J45909 Unspecified asthma, uncomplicated: Secondary | ICD-10-CM | POA: Insufficient documentation

## 2022-11-14 DIAGNOSIS — R1013 Epigastric pain: Secondary | ICD-10-CM | POA: Diagnosis present

## 2022-11-14 LAB — COMPREHENSIVE METABOLIC PANEL
ALT: 29 U/L (ref 0–44)
AST: 31 U/L (ref 15–41)
Albumin: 3.9 g/dL (ref 3.5–5.0)
Alkaline Phosphatase: 75 U/L (ref 38–126)
Anion gap: 10 (ref 5–15)
BUN: 13 mg/dL (ref 6–20)
CO2: 20 mmol/L — ABNORMAL LOW (ref 22–32)
Calcium: 8.9 mg/dL (ref 8.9–10.3)
Chloride: 105 mmol/L (ref 98–111)
Creatinine, Ser: 0.88 mg/dL (ref 0.44–1.00)
GFR, Estimated: >60 mL/min (ref >60)
Glucose, Bld: 102 mg/dL — ABNORMAL HIGH (ref 70–99)
Potassium: 3.7 mmol/L (ref 3.5–5.1)
Sodium: 135 mmol/L (ref 135–145)
Total Bilirubin: 0.6 mg/dL (ref 0.3–1.2)
Total Protein: 7.7 g/dL (ref 6.5–8.1)

## 2022-11-14 LAB — CBC WITH DIFFERENTIAL/PLATELET
Abs Immature Granulocytes: 0.03 10*3/uL (ref 0.00–0.07)
Basophils Absolute: 0 10*3/uL (ref 0.0–0.1)
Basophils Relative: 0 %
Eosinophils Absolute: 0.1 10*3/uL (ref 0.0–0.5)
Eosinophils Relative: 1 %
HCT: 39.9 % (ref 36.0–46.0)
Hemoglobin: 12.9 g/dL (ref 12.0–15.0)
Immature Granulocytes: 0 %
Lymphocytes Relative: 12 %
Lymphs Abs: 1.2 10*3/uL (ref 0.7–4.0)
MCH: 27.3 pg (ref 26.0–34.0)
MCHC: 32.3 g/dL (ref 30.0–36.0)
MCV: 84.4 fL (ref 80.0–100.0)
Monocytes Absolute: 0.4 10*3/uL (ref 0.1–1.0)
Monocytes Relative: 4 %
Neutro Abs: 8.8 10*3/uL — ABNORMAL HIGH (ref 1.7–7.7)
Neutrophils Relative %: 83 %
Platelets: 181 10*3/uL (ref 150–400)
RBC: 4.73 MIL/uL (ref 3.87–5.11)
RDW: 14.1 % (ref 11.5–15.5)
WBC: 10.6 10*3/uL — ABNORMAL HIGH (ref 4.0–10.5)
nRBC: 0 % (ref 0.0–0.2)

## 2022-11-14 LAB — PREGNANCY, URINE: Preg Test, Ur: NEGATIVE

## 2022-11-14 LAB — LIPASE, BLOOD: Lipase: 77 U/L — ABNORMAL HIGH (ref 11–51)

## 2022-11-14 MED ORDER — IOHEXOL 300 MG/ML  SOLN
100.0000 mL | Freq: Once | INTRAMUSCULAR | Status: AC | PRN
  Administered 2022-11-14: 100 mL via INTRAVENOUS

## 2022-11-14 MED ORDER — SODIUM CHLORIDE 0.9 % IV BOLUS
1000.0000 mL | Freq: Once | INTRAVENOUS | Status: AC
  Administered 2022-11-14: 1000 mL via INTRAVENOUS

## 2022-11-14 MED ORDER — MORPHINE SULFATE (PF) 4 MG/ML IV SOLN
4.0000 mg | Freq: Once | INTRAVENOUS | Status: AC
  Administered 2022-11-14: 4 mg via INTRAVENOUS
  Filled 2022-11-14: qty 1

## 2022-11-14 MED ORDER — ONDANSETRON 4 MG PO TBDP: 4.0000 mg | ORAL_TABLET | Freq: Three times a day (TID) | ORAL | 0 refills | Status: AC | PRN

## 2022-11-14 MED ORDER — PANTOPRAZOLE SODIUM 40 MG PO TBEC: 40.0000 mg | DELAYED_RELEASE_TABLET | Freq: Every day | ORAL | 0 refills | Status: AC

## 2022-11-14 MED ORDER — ONDANSETRON HCL 4 MG/2ML IJ SOLN
4.0000 mg | Freq: Once | INTRAMUSCULAR | Status: AC
  Administered 2022-11-14: 4 mg via INTRAVENOUS
  Filled 2022-11-14: qty 2

## 2022-11-14 MED ORDER — FENTANYL CITRATE PF 50 MCG/ML IJ SOSY
100.0000 ug | PREFILLED_SYRINGE | Freq: Once | INTRAMUSCULAR | Status: AC
  Administered 2022-11-14: 100 ug via INTRAVENOUS
  Filled 2022-11-14: qty 2

## 2022-11-14 MED ORDER — DICYCLOMINE HCL 20 MG PO TABS: 20.0000 mg | ORAL_TABLET | Freq: Three times a day (TID) | ORAL | 0 refills | Status: AC | PRN

## 2022-11-14 NOTE — ED Provider Notes (Signed)
Emergency Department Provider Note   I have reviewed the triage vital signs and the nursing notes.   HISTORY  Chief Complaint Abdominal Pain   HPI Dawn Gould is a 32 y.o. female with asthma presents emergency department with acute onset epigastric pain and right upper quadrant discomfort.  Symptoms began in the night and progressively worsened throughout the morning.  Denies nausea, vomiting, diarrhea.  No clear association with eating dinner last night.  Denies any chest pain.  No vaginal bleeding or discharge.  Her past surgical history includes prior cesarean section and tubal ligation.   Past Medical History:  Diagnosis Date   Asthma    Back pain    Hidradenitis suppurativa    takes periodic antibiotics    Review of Systems  Constitutional: No fever/chills Cardiovascular: Denies chest pain. Respiratory: Denies shortness of breath. Gastrointestinal: Positive RUQ abdominal pain.  No nausea, no vomiting.  No diarrhea.  No constipation. Genitourinary: Negative for dysuria. Musculoskeletal: Positive mid-back pain.  Skin: Negative for rash. Neurological: Negative for headaches, focal weakness or numbness.   ____________________________________________   PHYSICAL EXAM:  VITAL SIGNS: ED Triage Vitals  Enc Vitals Group     BP 11/14/22 0823 (!) 134/91     Pulse Rate 11/14/22 0823 90     Resp 11/14/22 0823 18     Temp 11/14/22 0823 98.2 F (36.8 C)     Temp Source 11/14/22 0823 Oral     SpO2 11/14/22 0823 100 %     Weight 11/14/22 0821 185 lb (83.9 kg)     Height 11/14/22 0821 5\' 3"  (1.6 m)    Constitutional: Alert and oriented. Patient appears uncomfortable but able to provide a full history.  Eyes: Conjunctivae are normal.  Head: Atraumatic. Nose: No congestion/rhinnorhea. Mouth/Throat: Mucous membranes are moist. Neck: No stridor.   Cardiovascular: Normal rate, regular rhythm. Good peripheral circulation. Grossly normal heart sounds.   Respiratory:  Normal respiratory effort.  No retractions. Lungs CTAB. Gastrointestinal: Soft with focal tenderness in the epigastric and RUQ region. No lower abdominal tenderness. No distention.  Musculoskeletal: No gross deformities of extremities. Neurologic:  Normal speech and language.  Skin:  Skin is warm, dry and intact. No rash noted.  ____________________________________________   LABS (all labs ordered are listed, but only abnormal results are displayed)  Labs Reviewed  COMPREHENSIVE METABOLIC PANEL - Abnormal; Notable for the following components:      Result Value   CO2 20 (*)    Glucose, Bld 102 (*)    All other components within normal limits  LIPASE, BLOOD - Abnormal; Notable for the following components:   Lipase 77 (*)    All other components within normal limits  CBC WITH DIFFERENTIAL/PLATELET - Abnormal; Notable for the following components:   WBC 10.6 (*)    Neutro Abs 8.8 (*)    All other components within normal limits  PREGNANCY, URINE   ____________________________________________  EKG   EKG Interpretation  Date/Time:  Tuesday November 14 2022 08:22:29 EDT Ventricular Rate:  85 PR Interval:  155 QRS Duration: 84 QT Interval:  372 QTC Calculation: 443 R Axis:   71 Text Interpretation: Sinus rhythm Confirmed by Alona Bene [phone]) on 11/14/2022 8:30:38 AM        ____________________________________________   PROCEDURES  Procedure(s) performed:   Procedures  None ____________________________________________   INITIAL IMPRESSION / ASSESSMENT AND PLAN / ED COURSE  Pertinent labs & imaging results that were available during my care of the patient were reviewed  by me and considered in my medical decision making (see chart for details).   This patient is Presenting for Evaluation of abdominal pain, which does require a range of treatment options, and is a complaint that involves a high risk of morbidity and mortality.  The Differential Diagnoses includes  but is not exclusive to acute cholecystitis, intrathoracic causes for epigastric abdominal pain, gastritis, duodenitis, pancreatitis, small bowel or large bowel obstruction, abdominal aortic aneurysm, hernia, gastritis, etc.   Critical Interventions-    Medications  morphine (PF) 4 MG/ML injection 4 mg (4 mg Intravenous Given 11/14/22 0851)  ondansetron (ZOFRAN) injection 4 mg (4 mg Intravenous Given 11/14/22 0849)  sodium chloride 0.9 % bolus 1,000 mL (0 mLs Intravenous Stopped 11/14/22 1248)  fentaNYL (SUBLIMAZE) injection 100 mcg (100 mcg Intravenous Given 11/14/22 0926)  iohexol (OMNIPAQUE) 300 MG/ML solution 100 mL (100 mLs Intravenous Contrast Given 11/14/22 1032)    Reassessment after intervention: symptoms improved.     Clinical Laboratory Tests Ordered, included CBC with mild leukocytosis. No AKI. Pregnancy negative. Mild lipase elevation.   Radiologic Tests Ordered, included CT abdomen/pelvis. I independently interpreted the images and agree with radiology interpretation.   Cardiac Monitor Tracing which shows NSR.    Social Determinants of Health Risk patient is a smoker.   Medical Decision Making: Summary:  Patient presents emergency department with acute onset epigastric and right upper quadrant abdominal pain.  She has focal tenderness on exam, worse in the right upper quadrant.  Appears uncomfortable.  Plan for screening labs, pain management, right upper quadrant ultrasound.   Reevaluation with update and discussion with patient. [place] reassuring. CT with mildly distended small bowel loops. No SBO. Symptoms improved. Stable for discharge.   Considered admission but no acute surgical process. Stable for discharge with close PCP follow up.   Patient's presentation is most consistent with acute presentation with potential threat to life or bodily function.   Disposition: discharge  ____________________________________________  FINAL CLINICAL IMPRESSION(S) / ED  DIAGNOSES  Final diagnoses:  Epigastric pain     NEW OUTPATIENT MEDICATIONS STARTED DURING THIS VISIT:  Discharge Medication List as of 11/14/2022 12:41 PM     START taking these medications   Details  dicyclomine (BENTYL) 20 MG tablet Take 1 tablet (20 mg total) by mouth 3 (three) times daily as needed for spasms., Starting Tue 11/14/2022, Normal    ondansetron (ZOFRAN-ODT) 4 MG disintegrating tablet Take 1 tablet (4 mg total) by mouth every 8 (eight) hours as needed., Starting Tue 11/14/2022, Normal    pantoprazole (PROTONIX) 40 MG tablet Take 1 tablet (40 mg total) by mouth daily., Starting Tue 11/14/2022, Normal        Note:  This document was prepared using Dragon voice recognition software and may include unintentional dictation errors.  Alona Bene, MD, [facility] Emergency Medicine    Troyce Gieske, Arlyss Repress, MD 11/15/22 [phone]

## 2022-11-14 NOTE — ED Notes (Signed)
US at bedside

## 2022-11-14 NOTE — ED Triage Notes (Signed)
Pt states that last night she started having some abdominal pain and was feeling like she couldn't get her breath. States that she has no nausea or vomiting. Also states that she has had a headache for 3 days

## 2022-11-14 NOTE — Discharge Instructions (Signed)

## 2024-01-05 IMAGING — CT CT ABD-PELV W/ CM
2 of 4 series · 16 of 46 positions shown, 18 images · IV contrast (Omnipaque)
Comparison: None Available.

CLINICAL DATA: 30-year-old female with recurrent left lower
quadrant, left abdominal pain. Pleuritic pain.

EXAM:
CT ABDOMEN AND PELVIS WITH CONTRAST
TECHNIQUE: Multidetector CT imaging of the abdomen and pelvis was performed
using the standard protocol following bolus administration of
intravenous contrast.

[Series 2: axial st · axial · 0.96mm/px · z∈[-450,-65]mm · 13 of 85 slices shown, 15 images]
[im 4/85  soft-tissue]
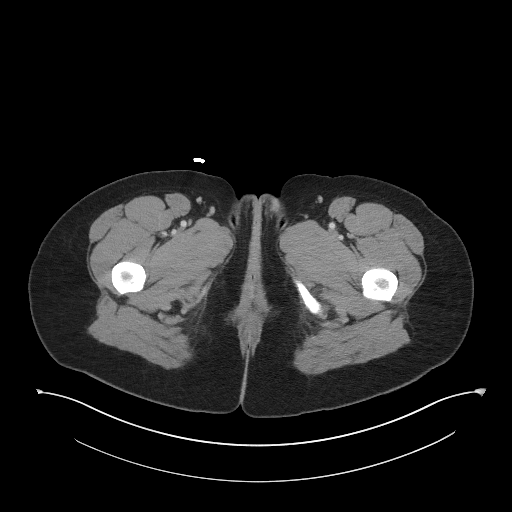
[im 4/85  bone]
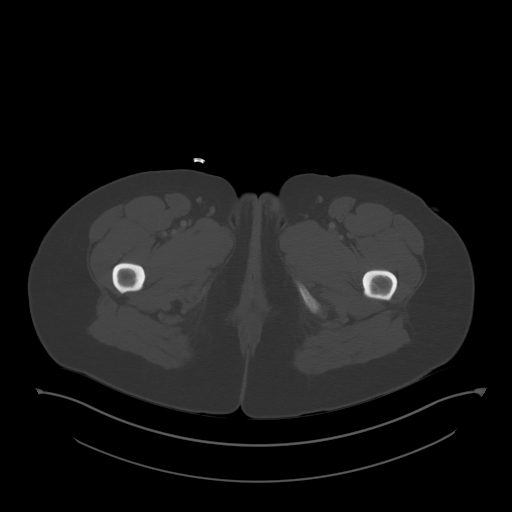
[im 11/85  soft-tissue]
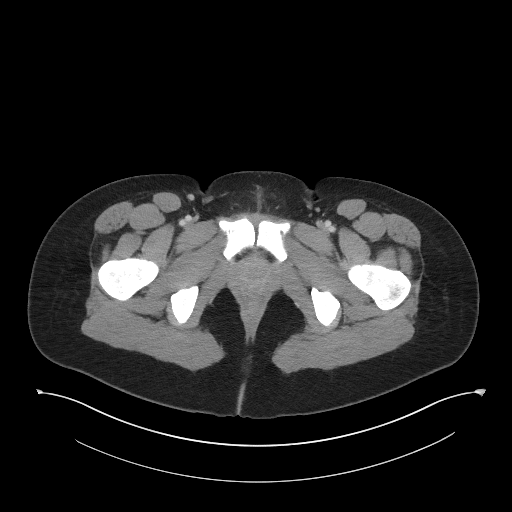
[im 17/85  soft-tissue]
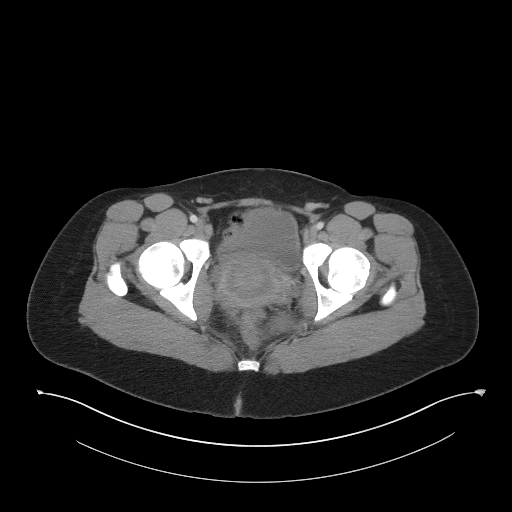
[im 24/85  soft-tissue]
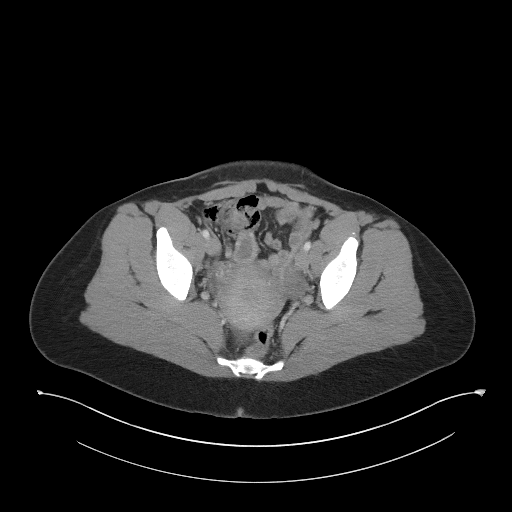
[im 31/85  soft-tissue]
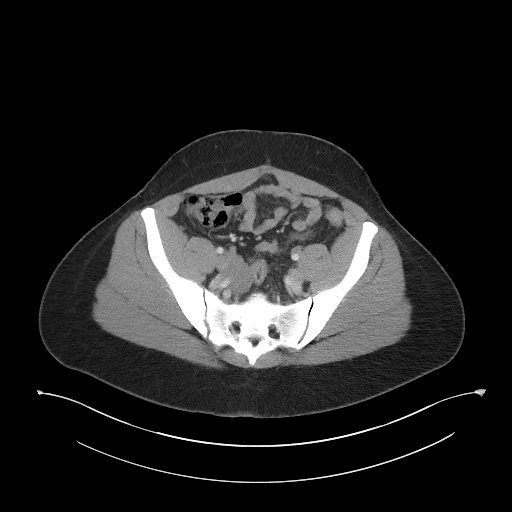
[im 37/85  soft-tissue]
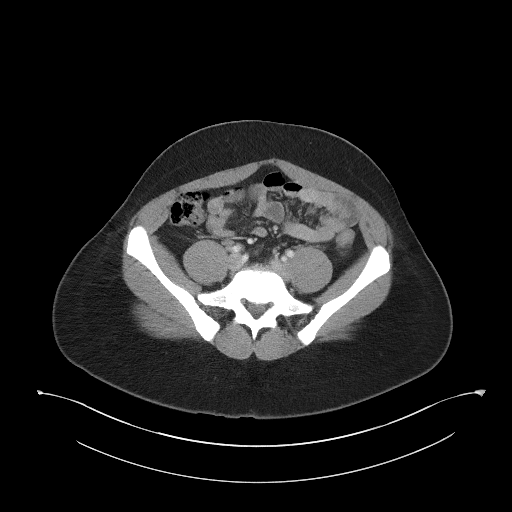
[im 44/85  soft-tissue]
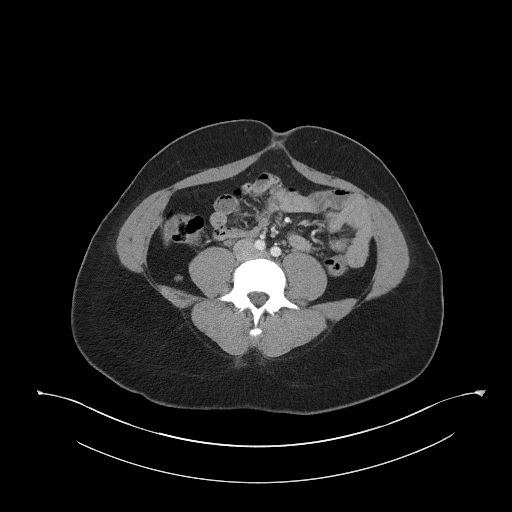
[im 48/85  soft-tissue]
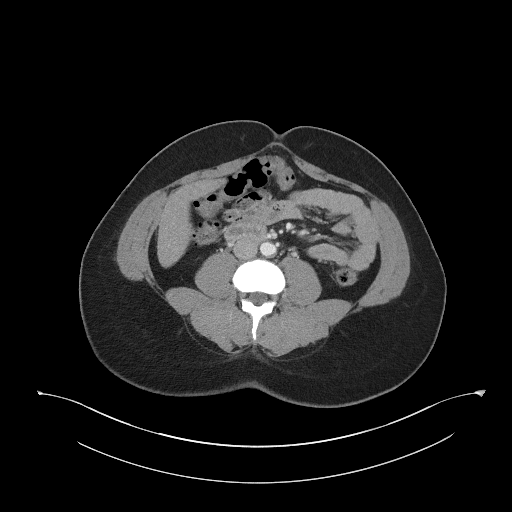
[im 54/85  soft-tissue]
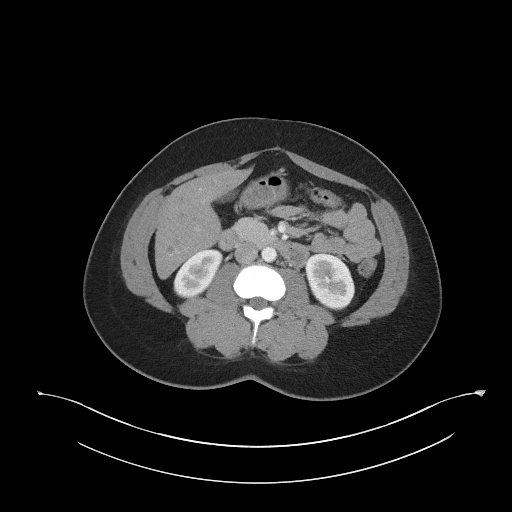
[im 54/85  bone]
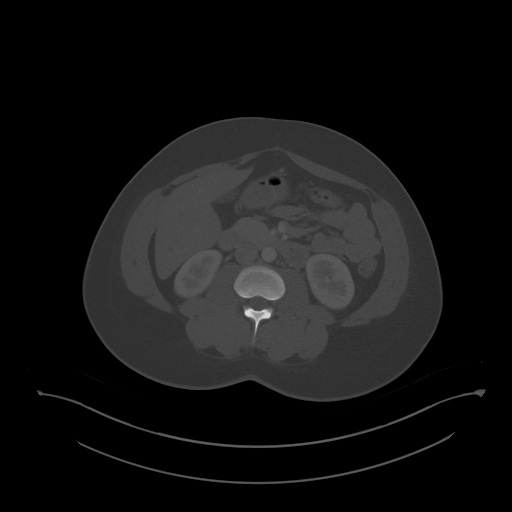
[im 61/85  soft-tissue]
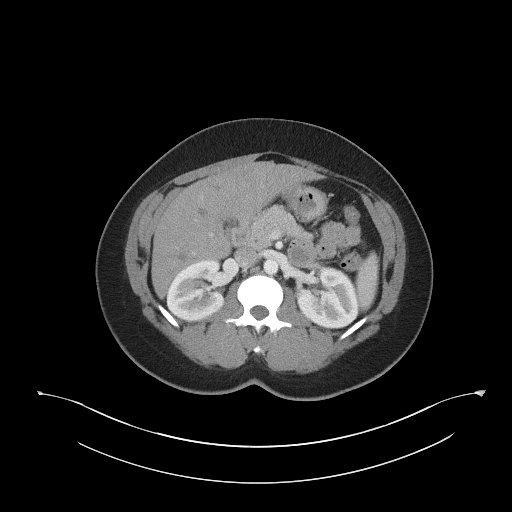
[im 68/85  soft-tissue]
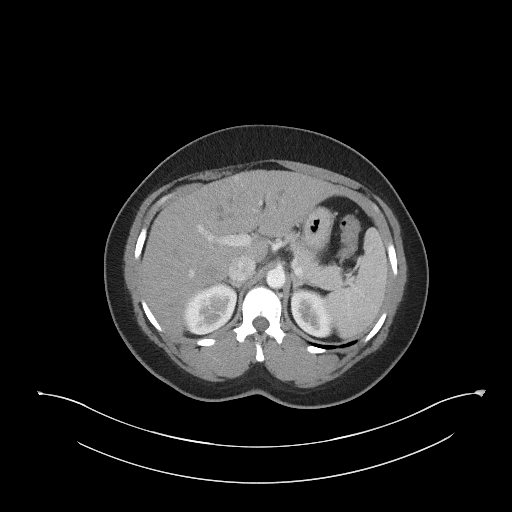
[im 74/85  soft-tissue]
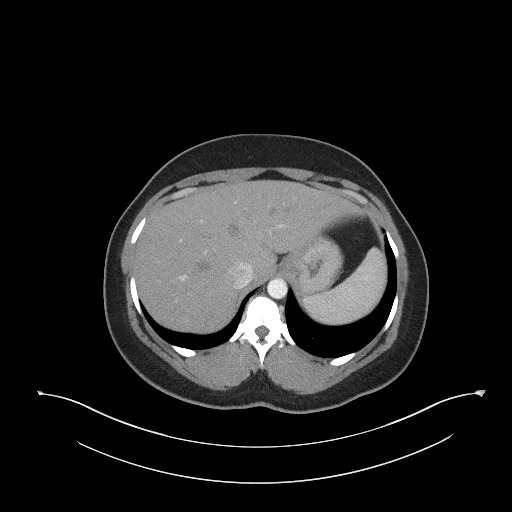
[im 81/85  soft-tissue]
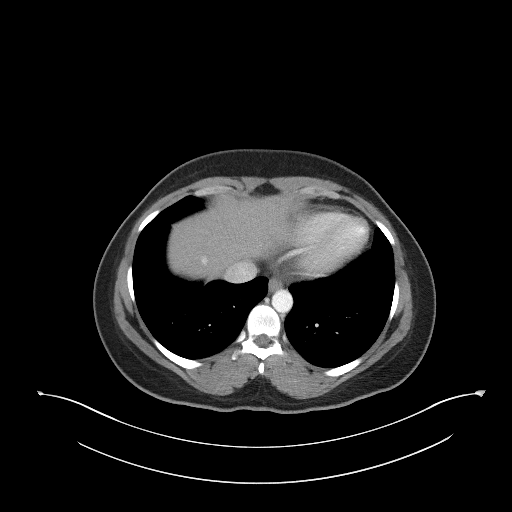

[Series 5: coronal st · coronal · 0.76mm/px · 3 of 97 slices shown]
[im 33/97  soft-tissue]
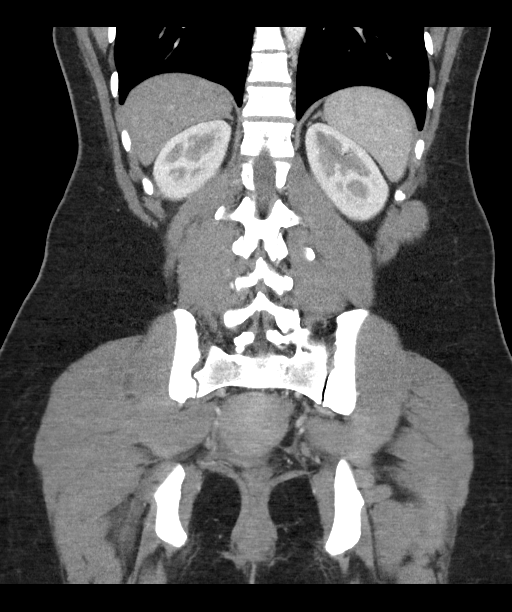
[im 43/97  soft-tissue]
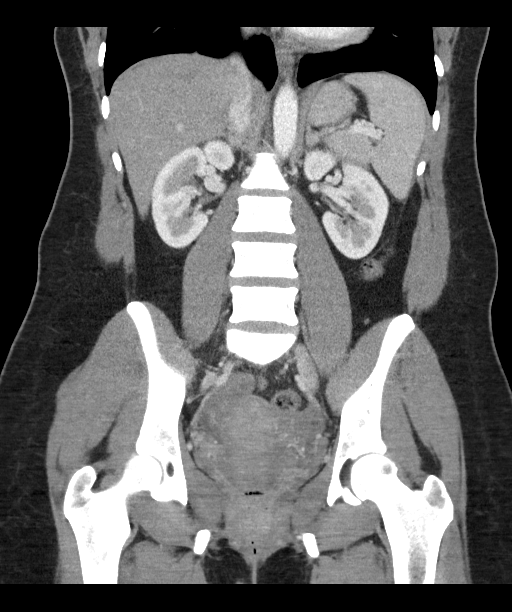
[im 54/97  soft-tissue]
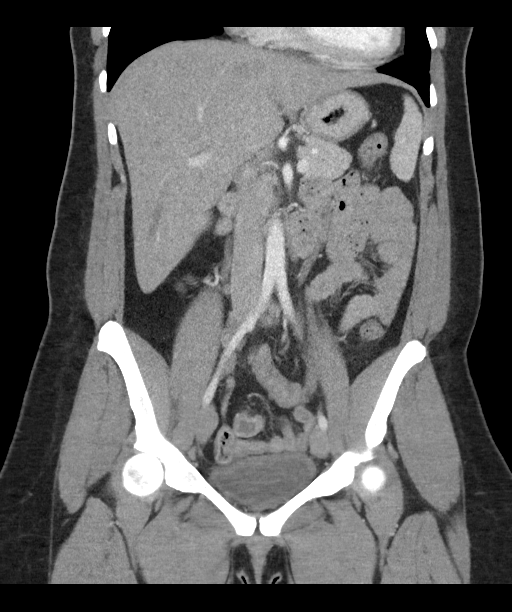

[16 of 46 positions shown; findings below may reference images not displayed]

RADIATION DOSE REDUCTION: This exam was performed according to the
departmental dose-optimization program which includes automated
exposure control, adjustment of the mA and/or kV according to
patient size and/or use of iterative reconstruction technique.

CONTRAST:  100mL OMNIPAQUE IOHEXOL 300 MG/ML  SOLN
FINDINGS: Lower chest: Lung bases are clear. No pericardial or pleural
effusion.

Hepatobiliary: Liver and gallbladder are within normal limits.

Pancreas: Negative.

Spleen: Negative. Incidental splenule (normal variant series 2,
image 17).

Adrenals/Urinary Tract: Normal adrenal glands. Kidneys appears
symmetric and normal. No hydronephrosis or pararenal inflammation.
Decompressed proximal ureters. Diminutive and unremarkable bladder.

Stomach/Bowel: Large bowel is mostly decompressed and unremarkable
throughout the abdomen and pelvis. Cecum is partially located in the
pelvis and elongated but normal appendix is identified on coronal
image 54 and 49. Terminal ileum is within normal limits. No dilated
small bowel. Stomach is mostly decompressed. Negative duodenum. No
free air, free fluid, or convincing mesenteric stranding.

Vascular/Lymphatic: Major arterial structures in the abdomen and
pelvis appear patent and normal. Portal venous system is patent. No
lymphadenopathy identified.

Reproductive: Within normal limits, retroverted uterus.

Other: Small volume of pelvic free fluid in the cul-de-sac with
simple fluid density.

Musculoskeletal: Negative.
IMPRESSION: No acute or inflammatory process identified in the abdomen or
pelvis; a small volume of pelvic free fluid is likely physiologic.
